# Patient Record
Sex: Male | Born: 1995 | Race: Black or African American | Hispanic: No | Marital: Single | Smoking: Current some day smoker
Health system: Southern US, Community
[De-identification: ages and names within clinical notes are randomized; demographics above are authoritative.]

## PROBLEM LIST (undated history)

## (undated) DIAGNOSIS — B009 Herpesviral infection, unspecified: Secondary | ICD-10-CM

## (undated) DIAGNOSIS — J45909 Unspecified asthma, uncomplicated: Secondary | ICD-10-CM

---

## 2015-11-10 ENCOUNTER — Encounter (HOSPITAL_COMMUNITY): Payer: Self-pay | Admitting: Emergency Medicine

## 2015-11-10 ENCOUNTER — Emergency Department (HOSPITAL_COMMUNITY)

## 2015-11-10 DIAGNOSIS — J4520 Mild intermittent asthma, uncomplicated: Secondary | ICD-10-CM | POA: Diagnosis not present

## 2015-11-10 DIAGNOSIS — F17200 Nicotine dependence, unspecified, uncomplicated: Secondary | ICD-10-CM | POA: Insufficient documentation

## 2015-11-10 DIAGNOSIS — R079 Chest pain, unspecified: Secondary | ICD-10-CM | POA: Diagnosis present

## 2015-11-10 DIAGNOSIS — J069 Acute upper respiratory infection, unspecified: Secondary | ICD-10-CM | POA: Diagnosis not present

## 2015-11-10 NOTE — ED Triage Notes (Signed)
Pt. reports generalized chest pain/tightness with SOB , cough and chest congestion onset this week , denies fever or chills.

## 2015-11-11 ENCOUNTER — Emergency Department (HOSPITAL_COMMUNITY)
Admission: EM | Admit: 2015-11-11 | Discharge: 2015-11-11 | Disposition: A | Discharge status: Home / Self Care | Attending: Emergency Medicine | Admitting: Emergency Medicine

## 2015-11-11 DIAGNOSIS — J4520 Mild intermittent asthma, uncomplicated: Secondary | ICD-10-CM

## 2015-11-11 DIAGNOSIS — J069 Acute upper respiratory infection, unspecified: Secondary | ICD-10-CM

## 2015-11-11 HISTORY — DX: Unspecified asthma, uncomplicated: J45.909

## 2015-11-11 LAB — BASIC METABOLIC PANEL
ANION GAP: 7 (ref 5–15)
BUN: 13 mg/dL (ref 6–20)
CALCIUM: 9.7 mg/dL (ref 8.9–10.3)
CO2: 26 mmol/L (ref 22–32)
Chloride: 103 mmol/L (ref 101–111)
Creatinine, Ser: 1.23 mg/dL (ref 0.61–1.24)
GFR calc Af Amer: >60 mL/min (ref >60)
GLUCOSE: 89 mg/dL (ref 65–99)
Potassium: 3.6 mmol/L (ref 3.5–5.1)
Sodium: 136 mmol/L (ref 135–145)

## 2015-11-11 LAB — CBC
HEMATOCRIT: 45.7 % (ref 39.0–52.0)
HEMOGLOBIN: 16.5 g/dL (ref 13.0–17.0)
MCH: 29.5 pg (ref 26.0–34.0)
MCHC: 36.1 g/dL — AB (ref 30.0–36.0)
MCV: 81.6 fL (ref 78.0–100.0)
Platelets: 209 10*3/uL (ref 150–400)
RBC: 5.6 MIL/uL (ref 4.22–5.81)
RDW: 13.6 % (ref 11.5–15.5)
WBC: 9.9 10*3/uL (ref 4.0–10.5)

## 2015-11-11 LAB — I-STAT TROPONIN, ED: TROPONIN I, POC: 0 ng/mL (ref 0.00–0.08)

## 2015-11-11 MED ORDER — PREDNISONE 20 MG PO TABS: 60.0000 mg | ORAL_TABLET | Freq: Every day | ORAL | 0 refills | Status: DC

## 2015-11-11 MED ORDER — PREDNISONE 20 MG PO TABS
60.0000 mg | ORAL_TABLET | Freq: Once | ORAL | Status: AC
  Administered 2015-11-11: 60 mg via ORAL
  Filled 2015-11-11: qty 3

## 2015-11-11 MED ORDER — BENZONATATE 100 MG PO CAPS: 100.0000 mg | ORAL_CAPSULE | Freq: Three times a day (TID) | ORAL | 0 refills | Status: DC | PRN

## 2015-11-11 MED ORDER — IBUPROFEN 800 MG PO TABS
800.0000 mg | ORAL_TABLET | Freq: Once | ORAL | Status: AC
  Administered 2015-11-11: 800 mg via ORAL
  Filled 2015-11-11: qty 1

## 2015-11-11 MED ORDER — ALBUTEROL SULFATE (2.5 MG/3ML) 0.083% IN NEBU
5.0000 mg | INHALATION_SOLUTION | Freq: Once | RESPIRATORY_TRACT | Status: AC
  Administered 2015-11-11: 5 mg via RESPIRATORY_TRACT

## 2015-11-11 NOTE — ED Provider Notes (Signed)
TIME SEEN: 5:30 AM  CHIEF COMPLAINT: Chest pain, nasal congestion, cough, wheezing, body aches, subjective fever  HPI: Pt is a 20 y.o. male with history of asthma who presents to the emergency department with 2-3 days of congestion, dry cough, wheezing and shortness of breath secondary to his asthma, subjective fever, body aches. Reports he has had some intermittent chest pain with coughing. No vomiting or diarrhea. No sick contacts. No history of PE or DVT.  ROS: See HPI Constitutional:  fever  Eyes: no drainage  ENT: no runny nose   Cardiovascular:   chest pain  Resp:  SOB  GI: no vomiting GU: no dysuria Integumentary: no rash  Allergy: no hives  Musculoskeletal: no leg swelling  Neurological: no slurred speech ROS otherwise negative  PAST MEDICAL HISTORY/PAST SURGICAL HISTORY:  Past Medical History:  Diagnosis Date  . Asthma     MEDICATIONS:  Prior to Admission medications   Not on File    ALLERGIES:  No Known Allergies  SOCIAL HISTORY:  Social History  Substance Use Topics  . Smoking status: Current Every Day Smoker  . Smokeless tobacco: Never Used  . Alcohol use Yes    FAMILY HISTORY: No family history on file.  EXAM: BP 129/79 (BP Location: Right Arm)   Pulse 78   Temp 99.3 F (37.4 C) (Oral)   Resp 26   Ht 5\' 7"  (1.702 m)   Wt 135 lb (61.2 kg)   SpO2 99%   BMI 21.14 kg/m  CONSTITUTIONAL: Alert and oriented and responds appropriately to questions. Well-appearing; well-nourished, Afebrile, nontoxic HEAD: Normocephalic EYES: Conjunctivae clear, PERRL ENT: normal nose; no rhinorrhea; moist mucous membranes NECK: Supple, no meningismus, no LAD  CARD: RRR; S1 and S2 appreciated; no murmurs, no clicks, no rubs, no gallops RESP: Normal chest excursion without splinting or tachypnea; breath sounds clear and equal bilaterally; no wheezes, no rhonchi, no rales, no hypoxia or respiratory distress, speaking full sentences ABD/GI: Normal bowel sounds;  non-distended; soft, non-tender, no rebound, no guarding, no peritoneal signs BACK:  The back appears normal and is non-tender to palpation, there is no CVA tenderness EXT: Normal ROM in all joints; non-tender to palpation; no edema; normal capillary refill; no cyanosis, no calf tenderness or swelling    SKIN: Normal color for age and race; warm; no rash NEURO: Moves all extremities equally, sensation to light touch intact diffusely, cranial nerves II through XII intact PSYCH: The patient's mood and manner are appropriate. Grooming and personal hygiene are appropriate.  MEDICAL DECISION MAKING: Patient here with symptoms that sound like a viral illness. Labs including troponin have been negative. Chest x-ray clear. EKG shows no ischemic abnormality. We'll treat symptomatically with ibuprofen, have him continue his albuterol inhaler at home and discharge with prednisone burst. He is not currently wheezing and no respiratory distress or increased work of breathing. Doubt PE, ACS. I do not feel this time he needs antibiotics. We'll give him outpatient follow-up as needed. Discussed return precautions.   At this time, I do not feel there is any life-threatening condition present. I have reviewed and discussed all results (EKG, imaging, lab, urine as appropriate), exam findings with patient/family. I have reviewed nursing notes and appropriate previous records.  I feel the patient is safe to be discharged home without further emergent workup and can continue workup as an outpatient. Discussed usual and customary return precautions. Patient/family verbalize understanding and are comfortable with this plan.  Outpatient follow-up has been provided. All questions have been  answered.      EKG Interpretation  Date/Time:  Wednesday November 11 2015 04:41:24 EDT Ventricular Rate:  77 PR Interval:    QRS Duration: 87 QT Interval:  338 QTC Calculation: 383 R Axis:   55 Text Interpretation:  Sinus rhythm  Atrial premature complex LVH by voltage No old tracing to compare Confirmed by Haili Donofrio,  DO, Dadrian Ballantine ([id number]) on 11/11/2015 4:54:00 AM         Layla MawKristen N Mana Morison, DO 11/11/15 [id number]

## 2015-11-11 NOTE — Discharge Instructions (Signed)
You may alternate between Tylenol 1000 mg every 6 hours as needed for fever and pain and ibuprofen 800 mg every 8 hours as needed for fever and pain. ° ° °To find a primary care or specialty doctor please call [phone] or [phone] to access "[facility] Find a Doctor Service." ° °You may also go on the [facility] website at [email or web address] ° °There are also multiple [facility] practices throughout the [place] that are frequently accepting new patients. You may find a clinic that is close to your home and contact them. ° °[facility] -  °[address] °[place] [address] °[phone] ° °[facility] in [place] (also locations in [place] and [place]) -  °[address] °[place] [address] [phone] ° °[facility] -  °[address] °[place] [address] [phone] ° ° °

## 2015-11-11 NOTE — ED Notes (Signed)
No answer when called for reassessment  

## 2016-07-21 ENCOUNTER — Encounter (HOSPITAL_COMMUNITY): Payer: Self-pay | Admitting: Emergency Medicine

## 2016-07-21 DIAGNOSIS — R300 Dysuria: Secondary | ICD-10-CM | POA: Insufficient documentation

## 2016-07-21 DIAGNOSIS — J45909 Unspecified asthma, uncomplicated: Secondary | ICD-10-CM | POA: Diagnosis not present

## 2016-07-21 DIAGNOSIS — Z79899 Other long term (current) drug therapy: Secondary | ICD-10-CM | POA: Insufficient documentation

## 2016-07-21 DIAGNOSIS — R319 Hematuria, unspecified: Secondary | ICD-10-CM | POA: Diagnosis not present

## 2016-07-21 DIAGNOSIS — F17200 Nicotine dependence, unspecified, uncomplicated: Secondary | ICD-10-CM | POA: Diagnosis not present

## 2016-07-21 LAB — URINALYSIS, ROUTINE W REFLEX MICROSCOPIC
BACTERIA UA: NONE SEEN
Squamous Epithelial / LPF: NONE SEEN

## 2016-07-21 NOTE — ED Triage Notes (Signed)
Pt to ED with c/o hematuria.  St's blood in urine was dark red.  Pt denies injury or pain

## 2016-07-22 ENCOUNTER — Emergency Department (HOSPITAL_COMMUNITY)
Admission: EM | Admit: 2016-07-22 | Discharge: 2016-07-22 | Disposition: A | Discharge status: Home / Self Care | Attending: Emergency Medicine | Admitting: Emergency Medicine

## 2016-07-22 DIAGNOSIS — R300 Dysuria: Secondary | ICD-10-CM

## 2016-07-22 DIAGNOSIS — R319 Hematuria, unspecified: Secondary | ICD-10-CM

## 2016-07-22 MED ORDER — DOXYCYCLINE HYCLATE 100 MG PO CAPS: 100.0000 mg | ORAL_CAPSULE | Freq: Two times a day (BID) | ORAL | 0 refills | Status: DC

## 2016-07-22 MED ORDER — STERILE WATER FOR INJECTION IJ SOLN
INTRAMUSCULAR | Status: AC
  Administered 2016-07-22: 01:00:00
  Filled 2016-07-22: qty 10

## 2016-07-22 MED ORDER — CEFTRIAXONE SODIUM 250 MG IJ SOLR
250.0000 mg | Freq: Once | INTRAMUSCULAR | Status: AC
  Administered 2016-07-22: 250 mg via INTRAMUSCULAR
  Filled 2016-07-22: qty 250

## 2016-07-22 MED ORDER — AZITHROMYCIN 250 MG PO TABS
1000.0000 mg | ORAL_TABLET | Freq: Once | ORAL | Status: AC
  Administered 2016-07-22: 1000 mg via ORAL
  Filled 2016-07-22: qty 4

## 2016-07-22 NOTE — ED Provider Notes (Signed)
[facility]-EMERGENCY DEPT Provider Note   CSN: [id number] Arrival date & time: 07/21/16  2205     History   Chief Complaint Chief Complaint  Patient presents with  . Hematuria    HPI Walter Stanley is a 21 y.o. male.  Patient presents to the ED with a chief complaint of hematuria.  He states that he has had 2 episodes that started tonight.  Reports hx of gonorrhea.  Denies any new discharge.  Denies any pain in his testicles, low back, or abdomen.  He reports mild stinging sensation when urinating.  Denies any pain in his rectum or pressure in pelvis.  He denies any other medical problems except for asthma.  There are no other modifying factors.   The history is provided by the patient. No language interpreter was used.    Past Medical History:  Diagnosis Date  . Asthma     There are no active problems to display for this patient.   History reviewed. No pertinent surgical history.     Home Medications    Prior to Admission medications   Medication Sig Start Date End Date Taking? Authorizing Provider  benzonatate (TESSALON) 100 MG capsule Take 1 capsule (100 mg total) by mouth 3 (three) times daily as needed for cough. 11/11/15   Kristen N Ward, DO  predniSONE (DELTASONE) 20 MG tablet Take 3 tablets (60 mg total) by mouth daily. 11/11/15   Layla MawKristen N Ward, DO    Family History No family history on file.  Social History Social History  Substance Use Topics  . Smoking status: Current Every Day Smoker  . Smokeless tobacco: Never Used  . Alcohol use Yes     Allergies   Patient has no known allergies.   Review of Systems Review of Systems  All other systems reviewed and are negative.    Physical Exam Updated Vital Signs BP 122/66 (BP Location: Right Arm)   Pulse 61   Temp 99.4 F (37.4 C) (Oral)   Resp 16   Ht 5\' 7"  (1.702 m)   Wt 56.7 kg   SpO2 100%   BMI 19.58 kg/m   Physical Exam  Constitutional: He is oriented to person, place, and time. No  distress.  HENT:  Head: Normocephalic and atraumatic.  Eyes: Conjunctivae and EOM are normal. Pupils are equal, round, and reactive to light.  Neck: No tracheal deviation present.  Cardiovascular: Normal rate.   Pulmonary/Chest: Effort normal. No respiratory distress.  Abdominal: Soft.  No focal abdominal tenderness, no RLQ tenderness or pain at McBurney's point, no RUQ tenderness or Murphy's sign, no left-sided abdominal tenderness, no fluid wave, or signs of peritonitis   Genitourinary:  Genitourinary Comments: No testicular tenderness, no masses, no lesions, no discharge, circumcised  Musculoskeletal: Normal range of motion.  Neurological: He is alert and oriented to person, place, and time.  Skin: Skin is warm and dry. He is not diaphoretic.  Psychiatric: Judgment normal.  Nursing note and vitals reviewed.    ED Treatments / Results  Labs (all labs ordered are listed, but only abnormal results are displayed) Labs Reviewed  URINALYSIS, ROUTINE W REFLEX MICROSCOPIC - Abnormal; Notable for the following:       Result Value   Color, Urine RED (*)    APPearance TURBID (*)    Glucose, UA   (*)    Value: TEST NOT REPORTED DUE TO COLOR INTERFERENCE OF URINE PIGMENT   Hgb urine dipstick   (*)    Value: TEST NOT  REPORTED DUE TO COLOR INTERFERENCE OF URINE PIGMENT   Bilirubin Urine   (*)    Value: TEST NOT REPORTED DUE TO COLOR INTERFERENCE OF URINE PIGMENT   Ketones, ur   (*)    Value: TEST NOT REPORTED DUE TO COLOR INTERFERENCE OF URINE PIGMENT   Protein, ur   (*)    Value: TEST NOT REPORTED DUE TO COLOR INTERFERENCE OF URINE PIGMENT   Nitrite   (*)    Value: TEST NOT REPORTED DUE TO COLOR INTERFERENCE OF URINE PIGMENT   Leukocytes, UA   (*)    Value: TEST NOT REPORTED DUE TO COLOR INTERFERENCE OF URINE PIGMENT   All other components within normal limits  URINE CULTURE    EKG  EKG Interpretation None       Radiology No results found.  Procedures Procedures  (including critical care time)  Medications Ordered in ED Medications  azithromycin (ZITHROMAX) tablet 1,000 mg (not administered)  cefTRIAXone (ROCEPHIN) injection 250 mg (not administered)     Initial Impression / Assessment and Plan / ED Course  I have reviewed the triage vital signs and the nursing notes.  Pertinent labs & imaging results that were available during my care of the patient were reviewed by me and considered in my medical decision making (see chart for details).     Patient with hx of gonorrhea.  Presents with hematuria and dysuria.  Will treat with rocephin, azithro and doxy.  Patient declines G/C swab.  Will send urine for culture.  Recommend follow-up with urology if symptoms do not resolve with tonight's treatment plan.  Final Clinical Impressions(s) / ED Diagnoses   Final diagnoses:  Hematuria, unspecified type  Dysuria    New Prescriptions New Prescriptions   DOXYCYCLINE (VIBRAMYCIN) 100 MG CAPSULE    Take 1 capsule (100 mg total) by mouth 2 (two) times daily.     Roxy Horseman, PA-C 07/22/16 0109    Zadie Rhine, MD 07/22/16 [phone]

## 2016-07-22 NOTE — ED Notes (Signed)
Pt reports that approximately 1 hour ago he started to urinate blood. Pt denies any injury.

## 2016-07-23 LAB — URINE CULTURE

## 2016-12-07 ENCOUNTER — Emergency Department (HOSPITAL_COMMUNITY)
Admission: EM | Admit: 2016-12-07 | Discharge: 2016-12-07 | Discharge status: Against Medical Advice | Attending: Emergency Medicine | Admitting: Emergency Medicine

## 2016-12-07 ENCOUNTER — Encounter (HOSPITAL_COMMUNITY): Payer: Self-pay | Admitting: Emergency Medicine

## 2016-12-07 ENCOUNTER — Emergency Department (HOSPITAL_COMMUNITY)

## 2016-12-07 DIAGNOSIS — Z79899 Other long term (current) drug therapy: Secondary | ICD-10-CM | POA: Diagnosis not present

## 2016-12-07 DIAGNOSIS — J45909 Unspecified asthma, uncomplicated: Secondary | ICD-10-CM | POA: Insufficient documentation

## 2016-12-07 DIAGNOSIS — F17200 Nicotine dependence, unspecified, uncomplicated: Secondary | ICD-10-CM | POA: Diagnosis not present

## 2016-12-07 DIAGNOSIS — Z23 Encounter for immunization: Secondary | ICD-10-CM | POA: Insufficient documentation

## 2016-12-07 DIAGNOSIS — M79675 Pain in left toe(s): Secondary | ICD-10-CM | POA: Insufficient documentation

## 2016-12-07 MED ORDER — TETANUS-DIPHTH-ACELL PERTUSSIS 5-2.5-18.5 LF-MCG/0.5 IM SUSP
0.5000 mL | Freq: Once | INTRAMUSCULAR | Status: AC
  Administered 2016-12-07: 0.5 mL via INTRAMUSCULAR
  Filled 2016-12-07: qty 0.5

## 2016-12-07 MED ORDER — IBUPROFEN 200 MG PO TABS
600.0000 mg | ORAL_TABLET | Freq: Once | ORAL | Status: AC
  Administered 2016-12-07: 600 mg via ORAL
  Filled 2016-12-07: qty 3

## 2016-12-07 NOTE — ED Provider Notes (Signed)
[facility]-EMERGENCY DEPT Provider Note   CSN: [id number] Arrival date & time: 12/07/16  1139     History   Chief Complaint Chief Complaint  Patient presents with  . Foot Injury    HPI Walter Stanley is a 21 y.o. male w PMHx asthma, Presenting to the ED for left toe pain that began 4-5 days ago after stepping on a piece of glass. He localizes pain to the plantar aspect of his toe. He states he stepped on a piece of glass however there is no glass visualized in his toe. He reports cleaning immediately with hydrogen peroxide and applying antibiotic ointment, however the pain on his toe has been getting worse since then. He reports today he woke up with 10/10 pain with ambulation. Denies purulent drainage, fever/chills, decreased range of motion. Denies history of immunocompromise or diabetes. Tetanus is not up-to-date.  The history is provided by the patient.    Past Medical History:  Diagnosis Date  . Asthma     There are no active problems to display for this patient.   History reviewed. No pertinent surgical history.     Home Medications    Prior to Admission medications   Medication Sig Start Date End Date Taking? Authorizing Provider  benzonatate (TESSALON) 100 MG capsule Take 1 capsule (100 mg total) by mouth 3 (three) times daily as needed for cough. 11/11/15   Ward, Layla Maw, DO  doxycycline (VIBRAMYCIN) 100 MG capsule Take 1 capsule (100 mg total) by mouth 2 (two) times daily. 07/22/16   Roxy Horseman, PA-C  predniSONE (DELTASONE) 20 MG tablet Take 3 tablets (60 mg total) by mouth daily. 11/11/15   Ward, Layla Maw, DO    Family History No family history on file.  Social History Social History  Substance Use Topics  . Smoking status: Current Every Day Smoker  . Smokeless tobacco: Never Used  . Alcohol use Yes     Allergies   Patient has no known allergies.   Review of Systems Review of Systems  Constitutional: Negative for chills and fever.    Gastrointestinal: Negative for nausea and vomiting.  Skin: Positive for wound.  Allergic/Immunologic: Negative for immunocompromised state.     Physical Exam Updated Vital Signs BP 102/77 (BP Location: Left Arm)   Pulse 60   Temp 98.9 F (37.2 C) (Oral)   Resp 15   Ht  (1.727 m)   Wt 56 kg (123 lb 9 oz)   SpO2 100%   BMI 18.79 kg/m   Physical Exam  Constitutional: He appears well-developed and well-nourished. No distress.  HENT:  Head: Normocephalic and atraumatic.  Eyes: Conjunctivae are normal.  Cardiovascular: Normal rate and intact distal pulses.   Pulmonary/Chest: Effort normal.  Musculoskeletal:  Left great toe with normal range of motion. 2cm area of edema/mild fluctuance on plantar aspect of great toe, not erythematous and no surrounding erythema. No purulent drainage. No deformities.  Neurological:  Normal sensation  Psychiatric: He has a normal mood and affect. His behavior is normal.  Nursing note and vitals reviewed.    ED Treatments / Results  Labs (all labs ordered are listed, but only abnormal results are displayed) Labs Reviewed - No data to display  EKG  EKG Interpretation None       Radiology Dg Foot Complete Left  Result Date: 12/07/2016 CLINICAL DATA:  Stepped on glass 4 days ago. The persistent pain and swelling. EXAM: LEFT FOOT - COMPLETE 3+ VIEW COMPARISON:  None. FINDINGS: The joint  spaces are normal. No acute bony findings. No obvious radiopaque foreign body. IMPRESSION: No acute bony findings or radiopaque foreign body. Electronically Signed   By: Rudie Meyer M.D.   On: 12/07/2016 13:29    Procedures Procedures (including critical care time)  Medications Ordered in ED Medications  ibuprofen (ADVIL,MOTRIN) tablet 600 mg (600 mg Oral Given 12/07/16 1322)  Tdap (BOOSTRIX) injection 0.5 mL (0.5 mLs Intramuscular Given 12/07/16 1323)     Initial Impression / Assessment and Plan / ED Course  I have reviewed the triage vital  signs and the nursing notes.  Pertinent labs & imaging results that were available during my care of the patient were reviewed by me and considered in my medical decision making (see chart for details).     Patient presenting with left great toe pain after stepping on glass 4-5 days ago. Neurovascularly intact. X-ray negative for retained foreign bodies. Tdap Updated. Planned to ultrasound area to evaluate for abscess, however patient decided to leave AMA because "the room was cold." Discussed risks with patient of leaving before medical treatment. Strict return precautions discussed.   I discussed the nature and purpose, risks and benefits, as well as, the alternatives of treatment. Time was given to allow the opportunity to ask questions and consider their options, and after the discussion, the patient decided to refuse the offerred treatment. The patient was informed that refusal could lead to, but was not limited to, death, permanent disability, or severe pain. If present, I asked the relatives or significant others to dissuade them without success. Prior to refusing, I determined that the patient had the capacity to make their decision and understood the consequences of that decision. After refusal, I made every reasonable opportunity to treat them to the best of my ability.  The patient was notified that they may return to the emergency department at any time for further treatment.     Final Clinical Impressions(s) / ED Diagnoses   Final diagnoses:  Great toe pain, left    New Prescriptions Discharge Medication List as of 12/07/2016  2:04 PM       Russo, [place] N, PA-C 12/07/16 1413    Lorre Nick, MD 12/07/16 1553

## 2016-12-07 NOTE — ED Notes (Signed)
Pt signed AMA due to not wanting to wait. PA has explained risks

## 2016-12-07 NOTE — ED Triage Notes (Signed)
Patient c/o stepping on glass 4-5 days ago. Patient reports that he has gone to work but continues to have pain on left posterior big toe/foot where he stepped on glass.  Patient denies any swellling

## 2017-02-11 ENCOUNTER — Emergency Department (HOSPITAL_COMMUNITY)
Admission: EM | Admit: 2017-02-11 | Discharge: 2017-02-11 | Disposition: A | Discharge status: Home / Self Care | Attending: Emergency Medicine | Admitting: Emergency Medicine

## 2017-02-11 ENCOUNTER — Emergency Department (HOSPITAL_COMMUNITY)

## 2017-02-11 ENCOUNTER — Encounter (HOSPITAL_COMMUNITY): Payer: Self-pay

## 2017-02-11 DIAGNOSIS — R05 Cough: Secondary | ICD-10-CM | POA: Diagnosis present

## 2017-02-11 DIAGNOSIS — F17200 Nicotine dependence, unspecified, uncomplicated: Secondary | ICD-10-CM | POA: Diagnosis not present

## 2017-02-11 DIAGNOSIS — Z79899 Other long term (current) drug therapy: Secondary | ICD-10-CM | POA: Insufficient documentation

## 2017-02-11 DIAGNOSIS — B349 Viral infection, unspecified: Secondary | ICD-10-CM | POA: Diagnosis not present

## 2017-02-11 NOTE — ED Triage Notes (Signed)
Patient complains of several days of cold symptoms with congestion and headache, states that he has been working in the cold and thinks related, alert and oriented, NAD

## 2017-02-11 NOTE — ED Provider Notes (Signed)
[facility]ENC[facility] Provider Note   CSN: [id number] Arrival date & time: 02/11/17  1342     History   Chief Complaint Chief Complaint  Patient presents with  . asthma/congestion    HPI Jonita AlbeeMalcom Demby is a 21 y.o. male.  HPI 21 year old male with a history of asthma presenting with 3-4 days of congestion, cough, mild headache, sore throat and chest tightness.  He states he is tried his albuterol inhaler with no relief.  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  He states that he has had "fluid in his lungs" before and he wants to make sure it is not that although he states it feels different than that.  Denies any fevers chills denies nausea, vomiting, diarrhea or abdominal pain.  He states he becomes slightly more short of breath when he walks but otherwise no alleviating or exacerbating factors.  Has not tried anything for his symptoms.  Past Medical History:  Diagnosis Date  . Asthma     There are no active problems to display for this patient.   History reviewed. No pertinent surgical history.     Home Medications    Prior to Admission medications   Medication Sig Start Date End Date Taking? Authorizing Provider  benzonatate (TESSALON) 100 MG capsule Take 1 capsule (100 mg total) by mouth 3 (three) times daily as needed for cough. 11/11/15   Ward, Layla MawKristen N, DO  doxycycline (VIBRAMYCIN) 100 MG capsule Take 1 capsule (100 mg total) by mouth 2 (two) times daily. 07/22/16   Roxy HorsemanBrowning, Robert, PA-C  predniSONE (DELTASONE) 20 MG tablet Take 3 tablets (60 mg total) by mouth daily. 11/11/15   Ward, Layla MawKristen N, DO    Family History No family history on file.  Social History Social History   Tobacco Use  . Smoking status: Current Every Day Smoker  . Smokeless tobacco: Never Used  Substance Use Topics  . Alcohol use: Yes  . Drug use: Yes    Types: Marijuana     Allergies   Patient has no known allergies.   Review of  Systems Review of Systems  Constitutional: Negative for chills and fever.  HENT: Positive for congestion and sore throat. Negative for ear pain.   Eyes: Negative for pain and visual disturbance.  Respiratory: Positive for cough and shortness of breath.   Cardiovascular: Negative for chest pain and palpitations.  Gastrointestinal: Negative for abdominal pain and vomiting.  Genitourinary: Negative for dysuria and hematuria.  Musculoskeletal: Negative for arthralgias and back pain.  Skin: Negative for color change and rash.  Neurological: Negative for seizures and syncope.  All other systems reviewed and are negative.    Physical Exam Updated Vital Signs BP 126/81 (BP Location: Right Arm)   Pulse 83   Temp 98.2 F (36.8 C) (Oral)   Resp 16   SpO2 100%   Physical Exam  Constitutional: He appears well-developed and well-nourished.  HENT:  Head: Normocephalic and atraumatic.  Mouth/Throat: Oropharynx is clear and moist.  Eyes: Conjunctivae are normal.  Neck: Neck supple.  Cardiovascular: Normal rate and regular rhythm.  No murmur heard. Pulmonary/Chest: Effort normal and breath sounds normal. No respiratory distress. He has no decreased breath sounds. He has no wheezes. He has no rhonchi. He has no rales.  Abdominal: Soft. There is no tenderness.  Musculoskeletal: He exhibits no edema.  Neurological: He is alert.  Skin: Skin is warm and dry. Capillary refill takes less than 2  seconds.  Psychiatric: He has a normal mood and affect.  Nursing note and vitals reviewed.    ED Treatments / Results  Labs (all labs ordered are listed, but only abnormal results are displayed) Labs Reviewed - No data to display  EKG  EKG Interpretation  Date/Time:  Saturday February 11 2017 13:59:47 EST Ventricular Rate:  82 PR Interval:    QRS Duration: 82 QT Interval:  329 QTC Calculation: 385 R Axis:   66 Text Interpretation:  Sinus rhythm Biatrial enlargement RSR' in V1 or V2, right VCD  or RVH Probable left ventricular hypertrophy ST elev, probable normal early repol pattern similar to previous  Confirmed by Crista CurbLiu, Dana [id number]) on 02/11/2017 2:04:56 PM       Radiology Dg Chest 2 View  Result Date: 02/11/2017 CLINICAL DATA:  21 year old male with 4 days of cold like symptoms. EXAM: CHEST  2 VIEW COMPARISON:  11/10/2015 FINDINGS: The heart size and mediastinal contours are within normal limits. Both lungs are clear. The visualized skeletal structures are unremarkable. IMPRESSION: No active cardiopulmonary disease. Electronically Signed   By: Sande BrothersSerena  Chacko M.D.   On: 02/11/2017 16:41    Procedures Procedures (including critical care time)  Medications Ordered in ED Medications - No data to display   Initial Impression / Assessment and Plan / ED Course  I have reviewed the triage vital signs and the nursing notes.  Pertinent labs & imaging results that were available during my care of the patient were reviewed by me and considered in my medical decision making (see chart for details).     21yoM with history of asthma presenting with 3-4 days of worsening cough, congestion, chest tightness, mild headache and sore throat.  Exam is as above and grossly unremarkable.  No wheezing on exam.  Chest x-ray unremarkable with no signs of pneumonia or pneumothorax.  Likely viral syndrome.  EKG is normal sinus rhythm with no ischemic changes. Doubt ACS or PE due to lack of risk factors and age.  Symptomatic treatment discussed.  Patient amenable to plan.  We will follow-up PCP.  Final Clinical Impressions(s) / ED Diagnoses   Final diagnoses:  Viral syndrome    ED Discharge Orders    None       Jalisia Puchalski Italyhad, MD 02/11/17 1704    Blane OharaZavitz, Joshua, MD 02/11/17 2326

## 2018-04-25 ENCOUNTER — Encounter (HOSPITAL_COMMUNITY): Payer: Self-pay | Admitting: Emergency Medicine

## 2018-04-25 ENCOUNTER — Other Ambulatory Visit: Payer: Self-pay

## 2018-04-25 ENCOUNTER — Emergency Department (HOSPITAL_COMMUNITY)
Admission: EM | Admit: 2018-04-25 | Discharge: 2018-04-25 | Disposition: A | Discharge status: Home / Self Care | Attending: Emergency Medicine | Admitting: Emergency Medicine

## 2018-04-25 ENCOUNTER — Emergency Department (HOSPITAL_COMMUNITY)

## 2018-04-25 DIAGNOSIS — Z79899 Other long term (current) drug therapy: Secondary | ICD-10-CM | POA: Diagnosis not present

## 2018-04-25 DIAGNOSIS — J45901 Unspecified asthma with (acute) exacerbation: Secondary | ICD-10-CM

## 2018-04-25 DIAGNOSIS — J069 Acute upper respiratory infection, unspecified: Secondary | ICD-10-CM | POA: Diagnosis not present

## 2018-04-25 DIAGNOSIS — B9789 Other viral agents as the cause of diseases classified elsewhere: Secondary | ICD-10-CM

## 2018-04-25 DIAGNOSIS — F17210 Nicotine dependence, cigarettes, uncomplicated: Secondary | ICD-10-CM | POA: Diagnosis not present

## 2018-04-25 DIAGNOSIS — R05 Cough: Secondary | ICD-10-CM | POA: Diagnosis present

## 2018-04-25 MED ORDER — PREDNISONE 20 MG PO TABS
50.0000 mg | ORAL_TABLET | Freq: Once | ORAL | Status: AC
  Administered 2018-04-25: 50 mg via ORAL
  Filled 2018-04-25: qty 3

## 2018-04-25 MED ORDER — IPRATROPIUM-ALBUTEROL 0.5-2.5 (3) MG/3ML IN SOLN
3.0000 mL | Freq: Once | RESPIRATORY_TRACT | Status: AC
  Administered 2018-04-25: 3 mL via RESPIRATORY_TRACT
  Filled 2018-04-25: qty 3

## 2018-04-25 MED ORDER — PREDNISONE 50 MG PO TABS: 50.0000 mg | ORAL_TABLET | Freq: Every day | ORAL | 0 refills | Status: AC

## 2018-04-25 NOTE — ED Notes (Signed)
Patient verbalizes understanding of discharge instructions. Opportunity for questioning and answers were provided. Armband removed by staff, pt discharged from ED. Pt ambulatory to lobby.  

## 2018-04-25 NOTE — ED Provider Notes (Signed)
[facility] Provider Note   CSN: [id number] Arrival date & time: 04/25/18  1853     History   Chief Complaint No chief complaint on file.   HPI Walter Stanley is a 23 y.o. male.  The history is provided by the patient and medical records. No language interpreter was used.  Cough  Cough characteristics:  Productive Sputum characteristics:  Green Severity:  Moderate Onset quality:  Gradual Duration:  1 week Timing:  Intermittent Progression:  Waxing and waning Chronicity:  Recurrent Smoker: yes   Relieved by:  Nothing Worsened by:  Nothing Ineffective treatments:  None tried Associated symptoms: chills, rhinorrhea, shortness of breath, sinus congestion and wheezing   Associated symptoms: no chest pain, no diaphoresis, no fever, no headaches, no rash and no weight loss     Past Medical History:  Diagnosis Date  . Asthma     There are no active problems to display for this patient.   No past surgical history on file.      Home Medications    Prior to Admission medications   Medication Sig Start Date End Date Taking? Authorizing Provider  benzonatate (TESSALON) 100 MG capsule Take 1 capsule (100 mg total) by mouth 3 (three) times daily as needed for cough. 11/11/15   Ward, Layla MawKristen N, DO  doxycycline (VIBRAMYCIN) 100 MG capsule Take 1 capsule (100 mg total) by mouth 2 (two) times daily. 07/22/16   Roxy HorsemanBrowning, Robert, PA-C  predniSONE (DELTASONE) 20 MG tablet Take 3 tablets (60 mg total) by mouth daily. 11/11/15   Ward, Layla MawKristen N, DO    Family History No family history on file.  Social History Social History   Tobacco Use  . Smoking status: Current Every Day Smoker  . Smokeless tobacco: Never Used  Substance Use Topics  . Alcohol use: Yes  . Drug use: Yes    Types: Marijuana     Allergies   Patient has no known allergies.   Review of Systems Review of Systems  Constitutional: Positive for chills. Negative for  diaphoresis, fatigue, fever and weight loss.  HENT: Positive for congestion and rhinorrhea.   Eyes: Negative for visual disturbance.  Respiratory: Positive for cough, chest tightness, shortness of breath and wheezing. Negative for choking and stridor.   Cardiovascular: Negative for chest pain.  Gastrointestinal: Negative for abdominal pain, constipation, diarrhea and nausea.  Genitourinary: Negative for flank pain and frequency.  Musculoskeletal: Negative for back pain, neck pain and neck stiffness.  Skin: Negative for rash and wound.  Neurological: Negative for weakness, light-headedness and headaches.  Psychiatric/Behavioral: Negative for agitation.  All other systems reviewed and are negative.    Physical Exam Updated Vital Signs BP 109/83   Pulse 76   Temp 98.7 F (37.1 C) (Oral)   Resp 16   Ht 5\' 8"  (1.727 m)   Wt 59.9 kg   SpO2 100%   BMI 20.07 kg/m   Physical Exam Vitals signs and nursing note reviewed.  Constitutional:      General: He is not in acute distress.    Appearance: He is well-developed. He is not ill-appearing, toxic-appearing or diaphoretic.  HENT:     Head: Normocephalic and atraumatic.     Mouth/Throat:     Pharynx: No pharyngeal swelling.  Eyes:     Conjunctiva/sclera: Conjunctivae normal.     Pupils: Pupils are equal, round, and reactive to light.  Neck:     Musculoskeletal: Neck supple.  Cardiovascular:  Rate and Rhythm: Normal rate and regular rhythm.     Heart sounds: No murmur.  Pulmonary:     Effort: Pulmonary effort is normal. No tachypnea or respiratory distress.     Breath sounds: No stridor. Wheezing present. No decreased breath sounds, rhonchi or rales.  Chest:     Chest wall: No tenderness.  Abdominal:     Palpations: Abdomen is soft.     Tenderness: There is no abdominal tenderness.  Skin:    General: Skin is warm and dry.     Capillary Refill: Capillary refill takes less than 2 seconds.  Neurological:     General: No  focal deficit present.     Mental Status: He is alert.  Psychiatric:        Mood and Affect: Mood normal.      ED Treatments / Results  Labs (all labs ordered are listed, but only abnormal results are displayed) Labs Reviewed - No data to display  EKG None  Radiology Dg Chest 2 View  Result Date: 04/25/2018 CLINICAL DATA:  Productive cough for 1.5 weeks.  Chills. EXAM: CHEST - 2 VIEW COMPARISON:  02/11/2017 FINDINGS: Mild hyperinflation. Normal heart size and pulmonary vascularity. No focal airspace disease or consolidation in the lungs. No blunting of costophrenic angles. No pneumothorax. Mediastinal contours appear intact. IMPRESSION: No active cardiopulmonary disease. Electronically Signed   By: Burman Nieves M.D.   On: 04/25/2018 19:47    Procedures Procedures (including critical care time)  Medications Ordered in ED Medications  ipratropium-albuterol (DUONEB) 0.5-2.5 (3) MG/3ML nebulizer solution 3 mL (3 mLs Nebulization Given 04/25/18 1918)  predniSONE (DELTASONE) tablet 50 mg (50 mg Oral Given by Other 04/25/18 2047)     Initial Impression / Assessment and Plan / ED Course  I have reviewed the triage vital signs and the nursing notes.  Pertinent labs & imaging results that were available during my care of the patient were reviewed by me and considered in my medical decision making (see chart for details).     Walter Stanley is a 23 y.o. male with a past medical history is no her asthma and pneumonia several months ago who presents with rhinorrhea, congestion, cough, chills, and shortness of breath.  Patient reports that his symptoms feel similar when he had pneumonia several months ago.  He reports that he was given antibiotics and was able to go home and did very well.  He says that over the last week he has had the similar symptoms and wants to rule out pneumonia.  He reports that he is having minimal shortness of breath and his wheezing has been slightly worsened  than baseline.  He denies chest pain or chest pressure.  He denies any nausea, vomiting, urinary symptoms or GI symptoms.  Denies recent trauma.  He reports that he is coughing up a green phlegm-like sputum.  He denies any recent medication changes and rarely has used an inhaler at home.  On exam, patient had some wheezing but no rhonchi.  No crackles.  Chest was nontender.  Abdomen was nontender.  Patient had normal gait.  Vital signs reassuring on arrival and patient is afebrile.  Clinical aspect he has a viral URI exacerbating his asthma however given his history of pneumonia and similar symptoms, will get x-ray to rule out pneumonia.  He will be given a breathing treatment.  If pneumonia is ruled out, anticipate patient being treated as an outpatient for asthma exacerbation in the setting of URI with steroids  and albuterol.  If pneumonia is discovered, will ambulate to see if he requires admission or further lab testing.  X-ray shows no pneumonia.  Patient is feeling much better after breathing treatment.  Patient was reassessed and had no wheezing.  Patient given a dose of prednisone and will given prescription for burst of steroids.  He will use his inhaler at home.  Suspect viral URI causing his symptoms.  Patient understood plan of care and return precautions.  Patient discharged in good condition with resolved shortness of breath and no PNA.      Final Clinical Impressions(s) / ED Diagnoses   Final diagnoses:  Viral URI with cough  Exacerbation of asthma, unspecified asthma severity, unspecified whether persistent    ED Discharge Orders    None     \ Clinical Impression: 1. Viral URI with cough   2. Exacerbation of asthma, unspecified asthma severity, unspecified whether persistent     Disposition: Discharge  Condition: Good  I have discussed the results, Dx and Tx plan with the pt(& family if present). He/she/they expressed understanding and agree(s) with the plan.  Discharge instructions discussed at great length. Strict return precautions discussed and pt &/or family have verbalized understanding of the instructions. No further questions at time of discharge.    Discharge Medication List as of 04/25/2018  8:42 PM    START taking these medications   Details  !! predniSONE (DELTASONE) 50 MG tablet Take 1 tablet (50 mg total) by mouth daily for 4 days., Starting Thu 04/26/2018, Until Mon 04/30/2018, Print     !! - Potential duplicate medications found. Please discuss with provider.      Follow Up: [facility] [address] [place] [id number] [phone] Schedule an appointment as soon as possible for a visit    [facility] [address] [id number] [place] [phone]       Tegeler, Canary Brimhristopher J, MD 04/26/18 [phone]

## 2018-04-25 NOTE — Discharge Instructions (Signed)
Your imaging today did not show pneumonia.  We suspect you have asthma exacerbation from likely viral upper respiratory infection.  Please take the steroid burst for the next 4 days starting tomorrow.  You received today's dose already.  Please use your inhaler at home.  Please rest.  If any symptoms change or worsen, is return to nearest emergency department.  Please wash your hands to prevent spread of viral infections.

## 2018-04-25 NOTE — ED Triage Notes (Signed)
Pt arrives to ED from home with complaints of cough of green phelm, SOB, chills and asthma flair ups.Pt was recently diagnosed with pneumonia in December.

## 2018-08-02 ENCOUNTER — Other Ambulatory Visit: Payer: Self-pay

## 2018-08-02 ENCOUNTER — Ambulatory Visit (HOSPITAL_COMMUNITY)
Admission: EM | Admit: 2018-08-02 | Discharge: 2018-08-02 | Disposition: A | Discharge status: Home / Self Care | Attending: Internal Medicine | Admitting: Internal Medicine

## 2018-08-02 ENCOUNTER — Encounter (HOSPITAL_COMMUNITY): Payer: Self-pay | Admitting: Emergency Medicine

## 2018-08-02 DIAGNOSIS — Z202 Contact with and (suspected) exposure to infections with a predominantly sexual mode of transmission: Secondary | ICD-10-CM | POA: Insufficient documentation

## 2018-08-02 DIAGNOSIS — R300 Dysuria: Secondary | ICD-10-CM

## 2018-08-02 DIAGNOSIS — L739 Follicular disorder, unspecified: Secondary | ICD-10-CM

## 2018-08-02 NOTE — ED Triage Notes (Signed)
Patient reports being tested one month ago in [place], [place].  Patient had a "bump" on shaft of penis at that time and was diagnosed with trich/gonorrhea.  Patient received an injection and took po antibiotics.  Patient reports urination and ejaculation just doesn't feel right, "uncomfortable feeling".

## 2018-08-02 NOTE — ED Provider Notes (Signed)
[facility]    CSN: [id number] Arrival date & time: 08/02/18  1229     History   Chief Complaint Chief Complaint  Patient presents with  . SEXUALLY TRANSMITTED DISEASE    HPI Walter Stanley is a 23 y.o. male with a history of asthma, recently treated for gonorrhea about a month ago in [facility]   Patient comes to urgent care today with complaints of some painless lesions at the base of the penis.  Lesions are noted a few days ago and has been persistent.  Lesions are not painful.  Patient has not tried any over-the-counter medications.  He also complains of some discomfort with urination and ejaculation.  He denies any urethral discharge.  Patient has been sexually active after his treatment for GC/trichomonas.  Sexual encounter was unprotected and sexual partner is not a regular sexual partner.  This was apparently is casual sexual encounter  Past Medical History:  Diagnosis Date  . Asthma     There are no active problems to display for this patient.   History reviewed. No pertinent surgical history.     Home Medications    Prior to Admission medications   Not on File    Family History History reviewed. No pertinent family history.  Social History Social History   Tobacco Use  . Smoking status: Current Every Day Smoker  . Smokeless tobacco: Never Used  Substance Use Topics  . Alcohol use: Yes  . Drug use: Yes    Types: Marijuana     Allergies   Patient has no known allergies.   Review of Systems Review of Systems  Constitutional: Negative.   Respiratory: Negative.   Cardiovascular: Negative.   Gastrointestinal: Negative for abdominal distention, abdominal pain, diarrhea and nausea.  Genitourinary: Positive for dysuria and genital sores. Negative for discharge, frequency, penile pain, penile swelling, scrotal swelling, testicular pain and urgency.  Musculoskeletal: Negative.   Allergic/Immunologic: Negative.       Physical Exam Triage Vital Signs ED Triage Vitals  Enc Vitals Group     BP 08/02/18 1325 99/72     Pulse Rate 08/02/18 1325 61     Resp 08/02/18 1325 16     Temp 08/02/18 1325 98.1 F (36.7 C)     Temp Source 08/02/18 1325 Oral     SpO2 08/02/18 1325 98 %     Weight --      Height --      Head Circumference --      Peak Flow --      Pain Score 08/02/18 1324 0     Pain Loc --      Pain Edu? --      Excl. in GC? --    No data found.  Updated Vital Signs BP 99/72 (BP Location: Left Arm)   Pulse 61   Temp 98.1 F (36.7 C) (Oral)   Resp 16   SpO2 98%   Visual Acuity Right Eye Distance:   Left Eye Distance:   Bilateral Distance:    Right Eye Near:   Left Eye Near:    Bilateral Near:     Physical Exam Cardiovascular:     Rate and Rhythm: Normal rate and regular rhythm.     Pulses: Normal pulses.     Heart sounds: Normal heart sounds.  Pulmonary:     Effort: Pulmonary effort is normal.  Abdominal:     General: Bowel sounds are normal. There is no distension.  Palpations: Abdomen is soft.     Tenderness: There is no abdominal tenderness.  Genitourinary:    Penis: Normal.      Scrotum/Testes: Normal.     Comments: Multiple pustular lesions at the base of the penis.  No surrounding erythema.  Not tender to palpation. Skin:    General: Skin is warm.     Capillary Refill: Capillary refill takes less than 2 seconds.     Coloration: Skin is not jaundiced.     Findings: No bruising or lesion.  Neurological:     General: No focal deficit present.      UC Treatments / Results  Labs (all labs ordered are listed, but only abnormal results are displayed) Labs Reviewed  RPR  HIV ANTIBODY (ROUTINE TESTING W REFLEX)  URINE CYTOLOGY ANCILLARY ONLY    EKG None  Radiology No results found.  Procedures Procedures (including critical care time)  Medications Ordered in UC Medications - No data to display  Initial Impression / Assessment and Plan / UC Course   I have reviewed the triage vital signs and the nursing notes.  Pertinent labs & imaging results that were available during my care of the patient were reviewed by me and considered in my medical decision making (see chart for details).     1.  Folliculitis: Personal hygiene Pustular lesions are mild and I suspect that they will resolve spontaneously Patient is advised to return to urgent care if lesions get bigger or if there is surrounding erythema. Patient verbalized understanding  2.  Possible STD exposure: Urine for GC/chlamydia/trichomonas Safe sex education given Final Clinical Impressions(s) / UC Diagnoses   Final diagnoses:  Exposure to sexually transmitted disease (STD)   Discharge Instructions   None    ED Prescriptions    None     Controlled Substance Prescriptions [place] Controlled Substance Registry consulted? No   Merrilee JanskyLamptey, Latosha Gaylord O, MD 08/03/18 1300

## 2018-08-03 LAB — URINE CYTOLOGY ANCILLARY ONLY
Chlamydia: NEGATIVE
Neisseria Gonorrhea: NEGATIVE

## 2018-08-03 LAB — RPR: RPR Ser Ql: NONREACTIVE

## 2018-08-03 LAB — HIV ANTIBODY (ROUTINE TESTING W REFLEX): HIV Screen 4th Generation wRfx: NONREACTIVE

## 2018-08-06 ENCOUNTER — Telehealth (HOSPITAL_COMMUNITY): Payer: Self-pay | Admitting: Emergency Medicine

## 2018-08-06 MED ORDER — DOXYCYCLINE HYCLATE 100 MG PO CAPS: 100.0000 mg | ORAL_CAPSULE | Freq: Two times a day (BID) | ORAL | 0 refills | Status: AC

## 2018-08-06 NOTE — Telephone Encounter (Signed)
Patient called asking about test results, results were negative, pt c/o still having tingling at the tip of his penis with pain when he ejaculates. Dr. Milus Glazier reviewed chart, told to send doxy to pat preferred pharmacy and if pt still has symptoms after medicine to return to be reseen. Pt agreeable to plan.

## 2018-11-04 ENCOUNTER — Other Ambulatory Visit: Payer: Self-pay

## 2018-11-04 ENCOUNTER — Emergency Department (HOSPITAL_COMMUNITY)
Admission: EM | Admit: 2018-11-04 | Discharge: 2018-11-04 | Disposition: A | Discharge status: Home / Self Care | Payer: Self-pay | Attending: Emergency Medicine | Admitting: Emergency Medicine

## 2018-11-04 ENCOUNTER — Encounter (HOSPITAL_COMMUNITY): Payer: Self-pay

## 2018-11-04 DIAGNOSIS — N4889 Other specified disorders of penis: Secondary | ICD-10-CM | POA: Insufficient documentation

## 2018-11-04 DIAGNOSIS — J45909 Unspecified asthma, uncomplicated: Secondary | ICD-10-CM | POA: Insufficient documentation

## 2018-11-04 DIAGNOSIS — F17200 Nicotine dependence, unspecified, uncomplicated: Secondary | ICD-10-CM | POA: Insufficient documentation

## 2018-11-04 DIAGNOSIS — Z8619 Personal history of other infectious and parasitic diseases: Secondary | ICD-10-CM

## 2018-11-04 HISTORY — DX: Herpesviral infection, unspecified: B00.9

## 2018-11-04 MED ORDER — VALACYCLOVIR HCL 1 G PO TABS: 1000.0000 mg | ORAL_TABLET | Freq: Every day | ORAL | 0 refills | Status: AC

## 2018-11-04 NOTE — Discharge Instructions (Addendum)
Take valtrex as prescribed. Take the entire course.  If you are continuing to have recurrent episodes, follow up with the health department for further management. Return to the ER with any new, worsening, or concerning symptoms.

## 2018-11-04 NOTE — ED Provider Notes (Signed)
[facility]EMERG[facility]PT Provider Note   CSN: [id number] Arrival date & time: 11/04/18  1615     History   Chief Complaint Chief Complaint  Patient presents with  . Groin Pain    HPI Walter Stanley is a 23 y.o. male presenting for evaluation of penile irritation.  Patient states for the past 2 days he has been having irritation at the base of his penis, which is atypical precursor for before he has a herpes outbreak.  Patient states he has had 3 herpes outbreaks in about 6 months.  He is sexually active with one male partner who is symptom-free, however has had herpes in the past.  She currently does not have any vesicles or lesions.  He denies any penile pain, testicular pain or swelling, or penile discharge.  He denies urinary symptoms.  He has no other medical problems, takes no medications daily.     HPI  Past Medical History:  Diagnosis Date  . Asthma   . Herpes     There are no active problems to display for this patient.   History reviewed. No pertinent surgical history.      Home Medications    Prior to Admission medications   Medication Sig Start Date End Date Taking? Authorizing Provider  valACYclovir (VALTREX) 1000 MG tablet Take 1 tablet (1,000 mg total) by mouth daily for 5 days. 11/04/18 11/09/18  Asaad Gulley, PA-C    Family History History reviewed. No pertinent family history.  Social History Social History   Tobacco Use  . Smoking status: Current Every Day Smoker  . Smokeless tobacco: Never Used  Substance Use Topics  . Alcohol use: Yes  . Drug use: Yes    Types: Marijuana     Allergies   Patient has no known allergies.   Review of Systems Review of Systems  Constitutional: Negative for fever.  Gastrointestinal: Negative for abdominal pain, nausea and vomiting.  Genitourinary: Negative for decreased urine volume, difficulty urinating, discharge, dysuria, frequency, genital sores, hematuria, penile  pain, penile swelling, scrotal swelling, testicular pain and urgency.       Penile irritation     Physical Exam Updated Vital Signs BP 112/68   Pulse 80   Temp 98.6 F (37 C) (Oral)   Resp 14   Ht 5\' 8"  (1.727 m)   Wt 59.9 kg   SpO2 97%   BMI 20.07 kg/m   Physical Exam Vitals signs and nursing note reviewed. Exam conducted with a chaperone present.  Constitutional:      General: He is not in acute distress.    Appearance: He is well-developed.     Comments: Sitting comfortably in the chair no acute distress  HENT:     Head: Normocephalic and atraumatic.  Eyes:     Conjunctiva/sclera: Conjunctivae normal.     Pupils: Pupils are equal, round, and reactive to light.  Neck:     Musculoskeletal: Normal range of motion and neck supple.  Cardiovascular:     Rate and Rhythm: Normal rate and regular rhythm.     Pulses: Normal pulses.  Pulmonary:     Effort: Pulmonary effort is normal. No respiratory distress.     Breath sounds: Normal breath sounds. No wheezing.  Abdominal:     General: There is no distension.     Palpations: Abdomen is soft. There is no mass.     Tenderness: There is no abdominal tenderness. There is no guarding or rebound.  Genitourinary:  Penis: Normal and circumcised.      Scrotum/Testes: Normal.     Epididymis:     Right: Normal.     Left: Normal.     Comments: No lesions noted.  No inguinal lymphadenopathy.  No tenderness palpation of testicles.  No penile discharge. Musculoskeletal: Normal range of motion.  Skin:    General: Skin is warm and dry.  Neurological:     Mental Status: He is alert and oriented to person, place, and time.      ED Treatments / Results  Labs (all labs ordered are listed, but only abnormal results are displayed) Labs Reviewed - No data to display  EKG None  Radiology No results found.  Procedures Procedures (including critical care time)  Medications Ordered in ED Medications - No data to display    Initial Impression / Assessment and Plan / ED Course  I have reviewed the triage vital signs and the nursing notes.  Pertinent labs & imaging results that were available during my care of the patient were reviewed by me and considered in my medical decision making (see chart for details).        Patient presenting for evaluation of penile discomfort which feels similar to his previous of herpes outbreaks.  Physical exam reassuring, no lesions noted at this time.  No penile discharge or other signs consistent with STDs.  Per chart review, patient was checked for STDs a month ago, was negative at that time.  Continues patient states he feels consistent with prior herpes outbreaks, will treat with Valtrex.  Encourage follow-up with health department for discussion of possible need for prophylactic antivirals.  At this time, patient appears safe for discharge.  Return precautions given.  Patient states he understands and agrees to plan.   Final Clinical Impressions(s) / ED Diagnoses   Final diagnoses:  Penile irritation  History of herpes genitalis    ED Discharge Orders         Ordered    valACYclovir (VALTREX) 1000 MG tablet  Daily     11/04/18 1727           Franchot Heidelberg, PA-C 11/04/18 1915    Maudie Flakes, MD 11/07/18 [phone]

## 2018-11-04 NOTE — ED Triage Notes (Signed)
States has herpes simplex two and has tingling for two days and wants medicine to prevent breakout.

## 2019-01-14 ENCOUNTER — Other Ambulatory Visit: Payer: Self-pay

## 2019-01-14 ENCOUNTER — Ambulatory Visit (HOSPITAL_COMMUNITY)
Admission: EM | Admit: 2019-01-14 | Discharge: 2019-01-14 | Disposition: A | Discharge status: Home / Self Care | Payer: Self-pay | Attending: Family Medicine | Admitting: Family Medicine

## 2019-01-14 ENCOUNTER — Encounter (HOSPITAL_COMMUNITY): Payer: Self-pay

## 2019-01-14 ENCOUNTER — Ambulatory Visit (INDEPENDENT_AMBULATORY_CARE_PROVIDER_SITE_OTHER): Payer: Self-pay

## 2019-01-14 DIAGNOSIS — J189 Pneumonia, unspecified organism: Secondary | ICD-10-CM

## 2019-01-14 MED ORDER — AMOXICILLIN-POT CLAVULANATE 875-125 MG PO TABS: 1.0000 | ORAL_TABLET | Freq: Two times a day (BID) | ORAL | 0 refills | Status: AC

## 2019-01-14 MED ORDER — AZITHROMYCIN 250 MG PO TABS: ORAL_TABLET | ORAL | 0 refills | Status: AC

## 2019-01-14 NOTE — ED Triage Notes (Signed)
Patient presents to Urgent Care with complaints of continued shortness of breath and chills since about a week ago. Patient reports he went to [facility] rex and they ruled out covid and pneumonia, gave him steroids and an inhaler, which he has been taking w/ no relief.

## 2019-01-14 NOTE — Discharge Instructions (Addendum)
Your xray does have an area which is concerning for pneumonia, therefore I have sent antibiotics for you to complete to treat this. Complete prednisone as previously prescribed.  Push fluids to ensure adequate hydration and keep secretions thin.  Tylenol and/or ibuprofen as needed for pain or fevers.   Use of inhaler as needed for wheezing or shortness of breath.   If symptoms worsen or do not improve in the next week to return to be seen or to follow up with your PCP.  Continue to decrease to quit smoking.

## 2019-01-14 NOTE — ED Provider Notes (Signed)
[facility]    CSN: [id number] Arrival date & time: 01/14/19  1353      History   Chief Complaint Chief Complaint  Patient presents with  . Chills  . Shortness of Breath  . Cough    HPI Walter Stanley is a 23 y.o. male.   Walter BorgMalcolm Arya presents with complaints of shortness of breath , cough, chills. Fatigue. Headache and head pressure. Chest is tight and painful with cough. Vomited last night, he feels like it was related to feeling hot and then cold. Started around 10/19. Went to the doctor on 10/21 and was negative for covid and had a negative cxr. Cough is less productive than before. Last night chills worse. Congestion. Cough is tight. Throat feels dry. No ear pain. No other nausea or diarrhea. No known ill contacts. 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 History of pneumonia in the past, states this originally felt similar. Smokes approximately 3-4 cigarettes a day. Inhaler briefly helps. No help with prednisone which was prescribed on 10/21. No tylenol or ibuprofen. Mucinex, has helped some. History  Of asthma.     ROS per HPI, negative if not otherwise mentioned.      Past Medical History:  Diagnosis Date  . Asthma   . Herpes     There are no active problems to display for this patient.   History reviewed. No pertinent surgical history.     Home Medications    Prior to Admission medications   Medication Sig Start Date End Date Taking? Authorizing Provider  albuterol (VENTOLIN HFA) 108 (90 Base) MCG/ACT inhaler Inhale 2 puffs into the lungs every 4 (four) hours as needed. 01/09/19  Yes [provider]  predniSONE (DELTASONE) 20 MG tablet  01/09/19  Yes [provider]  amoxicillin-clavulanate (AUGMENTIN) 875-125 MG tablet Take 1 tablet by mouth every 12 (twelve) hours for 10 days. 01/14/19 01/24/19  Georgetta HaberBurky, Aryia Delira B, NP  azithromycin (ZITHROMAX) 250 MG tablet Take 2 tablets  (500 mg total) by mouth daily for 1 day, THEN 1 tablet (250 mg total) daily for 4 days. 01/14/19 01/19/19  Georgetta HaberBurky, Carr Shartzer B, NP    Family History Family History  Problem Relation Age of Onset  . Hypertension Mother   . Diabetes Father     Social History Social History   Tobacco Use  . Smoking status: Current Some Day Smoker    Types: Cigarettes  . Smokeless tobacco: Never Used  Substance Use Topics  . Alcohol use: Yes  . Drug use: Yes    Types: Marijuana     Allergies   Patient has no known allergies.   Review of Systems Review of Systems   Physical Exam Triage Vital Signs ED Triage Vitals  Enc Vitals Group     BP 01/14/19 1454 107/68     Pulse Rate 01/14/19 1454 81     Resp 01/14/19 1454 17     Temp 01/14/19 1454 99 F (37.2 C)     Temp Source 01/14/19 1454 Oral     SpO2 01/14/19 1454 99 %     Weight --      Height --      Head Circumference --      Peak Flow --      Pain Score 01/14/19 1452 2     Pain Loc --      Pain Edu? --      Excl. in GC? --  No data found.  Updated Vital Signs BP 107/68 (BP Location: Left Arm)   Pulse 81   Temp 99 F (37.2 C) (Oral)   Resp 17   SpO2 99%    Physical Exam Constitutional:      Appearance: He is well-developed.  Cardiovascular:     Rate and Rhythm: Normal rate and regular rhythm.  Pulmonary:     Effort: Pulmonary effort is normal.     Breath sounds: Examination of the right-lower field reveals decreased breath sounds. Examination of the left-lower field reveals decreased breath sounds. Decreased breath sounds present.  Skin:    General: Skin is warm and dry.  Neurological:     Mental Status: He is alert and oriented to person, place, and time.      UC Treatments / Results  Labs (all labs ordered are listed, but only abnormal results are displayed) Labs Reviewed - No data to display  EKG   Radiology Dg Chest 2 View  Result Date: 01/14/2019 CLINICAL DATA:  Cough and shortness of breath  EXAM: CHEST - 2 VIEW COMPARISON:  April 25, 2018 FINDINGS: There is atelectatic change in the left base region with apparent small left pleural effusion. There may be superimposed infiltrate within this atelectasis. Lungs elsewhere are clear. Heart size and pulmonary vascularity are normal. No adenopathy. No bone lesions. IMPRESSION: Atelectatic change left base with small left pleural effusion. Suspect pneumonia intermingled with atelectasis in this area. Lungs elsewhere clear. Heart size normal. No adenopathy evident. Electronically Signed   By: Bretta Bang III M.D.   On: 01/14/2019 15:36    Procedures Procedures (including critical care time)  Medications Ordered in UC Medications - No data to display  Initial Impression / Assessment and Plan / UC Course  I have reviewed the triage vital signs and the nursing notes.  Pertinent labs & imaging results that were available during my care of the patient were reviewed by me and considered in my medical decision making (see chart for details).     Concern for LLL pneumonia on chest xray, has had similar in the past. Recent negative covid. Will cover with antibiotics at this time. Encouraged follow up with pcp for recheck in a few weeks. Return precautions provided. Patient verbalized understanding and agreeable to plan.   Final Clinical Impressions(s) / UC Diagnoses   Final diagnoses:  Community acquired pneumonia of left lower lobe of lung     Discharge Instructions     Your xray does have an area which is concerning for pneumonia, therefore I have sent antibiotics for you to complete to treat this. Complete prednisone as previously prescribed.  Push fluids to ensure adequate hydration and keep secretions thin.  Tylenol and/or ibuprofen as needed for pain or fevers.   Use of inhaler as needed for wheezing or shortness of breath.   If symptoms worsen or do not improve in the next week to return to be seen or to follow up with your  PCP.  Continue to decrease to quit smoking.      ED Prescriptions    Medication Sig Dispense Auth. Provider   amoxicillin-clavulanate (AUGMENTIN) 875-125 MG tablet Take 1 tablet by mouth every 12 (twelve) hours for 10 days. 20 tablet Linus Mako B, NP   azithromycin (ZITHROMAX) 250 MG tablet Take 2 tablets (500 mg total) by mouth daily for 1 day, THEN 1 tablet (250 mg total) daily for 4 days. 6 tablet Georgetta Haber, NP     PDMP  not reviewed this encounter.   Zigmund Gottron, NP 01/15/19 [phone]

## 2019-04-09 ENCOUNTER — Emergency Department (HOSPITAL_COMMUNITY)
Admission: EM | Admit: 2019-04-09 | Discharge: 2019-04-09 | Disposition: A | Discharge status: Home / Self Care | Attending: Emergency Medicine | Admitting: Emergency Medicine

## 2019-04-09 ENCOUNTER — Other Ambulatory Visit: Payer: Self-pay

## 2019-04-09 ENCOUNTER — Ambulatory Visit (HOSPITAL_COMMUNITY)
Admission: EM | Admit: 2019-04-09 | Discharge: 2019-04-09 | Discharge status: Against Medical Advice | Source: Home / Self Care

## 2019-04-09 DIAGNOSIS — F17210 Nicotine dependence, cigarettes, uncomplicated: Secondary | ICD-10-CM | POA: Diagnosis not present

## 2019-04-09 DIAGNOSIS — F1210 Cannabis abuse, uncomplicated: Secondary | ICD-10-CM | POA: Insufficient documentation

## 2019-04-09 DIAGNOSIS — Z8619 Personal history of other infectious and parasitic diseases: Secondary | ICD-10-CM | POA: Insufficient documentation

## 2019-04-09 DIAGNOSIS — Z760 Encounter for issue of repeat prescription: Secondary | ICD-10-CM | POA: Diagnosis not present

## 2019-04-09 DIAGNOSIS — J45909 Unspecified asthma, uncomplicated: Secondary | ICD-10-CM | POA: Diagnosis not present

## 2019-04-09 DIAGNOSIS — Z79899 Other long term (current) drug therapy: Secondary | ICD-10-CM | POA: Insufficient documentation

## 2019-04-09 MED ORDER — VALACYCLOVIR HCL 1 G PO TABS: 1000.0000 mg | ORAL_TABLET | Freq: Every day | ORAL | 0 refills | Status: AC

## 2019-04-09 NOTE — Discharge Instructions (Addendum)
You have been diagnosed today with Medication Refill and history of herpes.  At this time there does not appear to be the presence of an emergent medical condition, however there is always the potential for conditions to change. Please read and follow the below instructions.  Please return to the Emergency Department immediately for any new or worsening symptoms. Please be sure to follow up with your Primary Care Provider within one week regarding your visit today; please call their office to schedule an appointment even if you are feeling better for a follow-up visit. You have been given a refill of the medication Valtrex today.  As we discussed this medication may not help improve your current outbreak as it began multiple days ago.  In the future it is recommended that treatment for herpes began within 1 day of outbreak.  Please use the referrals given to you on this discharge paperwork to establish a primary care provider for future medication refills.  You may also contact the [facility] department regarding future STD concerns. Additionally as we discussed since you refused physical examination of your herpes lesions today I am unable to tell if any additional work-up or different medications may be needed.  Please follow-up with your primary care provider for recheck this week and return to the ER immediately for any new or worsening symptoms.  Get help right away if: Your symptoms are not improving with medicine. You have a fever. You have abdominal pain. You have redness, swelling, or pain in your eye. You notice new sores on other parts of your body. You have any new/concerning or worsening of symptoms  Please read the additional information packets attached to your discharge summary.  Do not take your medicine if  develop an itchy rash, swelling in your mouth or lips, or difficulty breathing; call 911 and seek immediate emergency medical attention if this occurs.  Note:  Portions of this text may have been transcribed using voice recognition software. Every effort was made to ensure accuracy; however, inadvertent computerized transcription errors may still be present.

## 2019-04-09 NOTE — ED Triage Notes (Signed)
Pt requesting refill of medication for recurrent herpes outbreak. VSS. NAD at present.

## 2019-04-09 NOTE — ED Notes (Signed)
Pt verbalized understanding of discharge instructions. Prescriptions reviewed, pt ambulated to lobby independently.

## 2019-04-09 NOTE — ED Provider Notes (Signed)
[place] [facility] DEPART[facility] Provider Note   CSN: [id number] Arrival date & time: 04/09/19  1452     History Chief Complaint  Patient presents with  . Medication Refill    Walter Stanley is a 24 y.o. male with history of herpes and asthma.  Patient requesting prescription of valacyclovir for a herpes outbreak.  He reports outbreak started to his penis 7 days ago, he has tried topical creams from the pharmacy with minimal relief.  He reports he has used valacyclovir for herpes infection in the past with improvement but has not been able to obtain a refill as he has no primary medical provider.  He reports mild burning constant, no aggravating factors, nonradiating slightly improved with cream.  He reports this is exactly similar to previous herpes outbreaks.  Denies fever/chills, headache, vision change, abdominal pain, nausea/vomiting, arthralgias/myalgias, penile discharge, hematuria/dysuria, new sexual encounters or concern for other STI. HPI     Past Medical History:  Diagnosis Date  . Asthma   . Herpes     There are no problems to display for this patient.   No past surgical history on file.     Family History  Problem Relation Age of Onset  . Hypertension Mother   . Diabetes Father     Social History   Tobacco Use  . Smoking status: Current Some Day Smoker    Types: Cigarettes  . Smokeless tobacco: Never Used  Substance Use Topics  . Alcohol use: Yes  . Drug use: Yes    Types: Marijuana    Home Medications Prior to Admission medications   Medication Sig Start Date End Date Taking? Authorizing Provider  albuterol (VENTOLIN HFA) 108 (90 Base) MCG/ACT inhaler Inhale 2 puffs into the lungs every 4 (four) hours as needed. 01/09/19   [provider]  predniSONE (DELTASONE) 20 MG tablet  01/09/19   [provider]  valACYclovir (VALTREX) 1000 MG tablet Take 1 tablet (1,000 mg total) by mouth daily for 5 days. 04/09/19  04/14/19  Deliah Boston, PA-C    Allergies    Patient has no known allergies.  Review of Systems   Review of Systems Ten systems are reviewed and are negative for acute change except as noted in the HPI  Physical Exam Updated Vital Signs BP 115/79 (BP Location: Right Arm)   Pulse 65   Temp 98.4 F (36.9 C) (Oral)   Resp 16   SpO2 100%   Physical Exam Constitutional:      General: He is not in acute distress.    Appearance: Normal appearance. He is well-developed. He is not ill-appearing or diaphoretic.  HENT:     Head: Normocephalic and atraumatic.     Right Ear: External ear normal.     Left Ear: External ear normal.     Nose: Nose normal.  Eyes:     General: Vision grossly intact. Gaze aligned appropriately.     Pupils: Pupils are equal, round, and reactive to light.  Neck:     Trachea: Trachea and phonation normal. No tracheal deviation.  Pulmonary:     Effort: Pulmonary effort is normal. No respiratory distress.  Genitourinary:    Comments: Patient refused GU examination Musculoskeletal:        General: Normal range of motion.     Cervical back: Normal range of motion.  Skin:    General: Skin is warm and dry.  Neurological:     Mental Status: He is alert.  GCS: GCS eye subscore is 4. GCS verbal subscore is 5. GCS motor subscore is 6.     Comments: Speech is clear and goal oriented, follows commands Major Cranial nerves without deficit, no facial droop Moves extremities without ataxia, coordination intact Normal gait  Psychiatric:        Behavior: Behavior normal.     ED Results / Procedures / Treatments   Labs (all labs ordered are listed, but only abnormal results are displayed) Labs Reviewed - No data to display  EKG None  Radiology No results found.  Procedures Procedures (including critical care time)  Medications Ordered in ED Medications - No data to display  ED Course  I have reviewed the triage vital signs and the nursing  notes.  Pertinent labs & imaging results that were available during my care of the patient were reviewed by me and considered in my medical decision making (see chart for details).    MDM Rules/Calculators/A&P                     24 year old male requesting refill of Valtrex for genital herpes onset 7 days ago.  He has no PCP to fill his prescription and has been using OTC cream with minimal relief.  He refused GU examination today, I had a discussion with patient of the need of a physical examination for definitive diagnosis and for correct medication treatment and future work-up and he stated understanding.  He still refused physical examination or any testing today after our discussion.  He reports he has had multiple flareups of genital herpes in the past and this is exactly similar.  I advised patient that a prescription of Valtrex would likely be unhelpful for his current episode as he is 7 days into his course of illness at this time and he stated understanding.  I advised patient should begin treatment with Valtrex for future herpes outbreak within 1 day of symptoms he stated understanding.  Discussed case with Dr. Anitra Lauth, will give patient Valtrex prescription today and encouraged him to follow-up with PCP and health department for follow-up.  Patient states understanding to return to the ER immediately for any new or worsening symptoms and that he may return at any time for full work-up including thorough physical examination.  At this time there does not appear to be any evidence of an acute emergency medical condition and the patient appears stable for discharge with appropriate outpatient follow up. Diagnosis was discussed with patient who verbalizes understanding of care plan and is agreeable to discharge. I have discussed return precautions with patient who verbalizes understanding of return precautions. Patient encouraged to follow-up with their PCP. All questions answered.  Patient has  been discharged in good condition.  Note: Portions of this report may have been transcribed using voice recognition software. Every effort was made to ensure accuracy; however, inadvertent computerized transcription errors may still be present. Final Clinical Impression(s) / ED Diagnoses Final diagnoses:  Encounter for medication refill  History of herpes genitalis    Rx / DC Orders ED Discharge Orders         Ordered    valACYclovir (VALTREX) 1000 MG tablet  Daily     04/09/19 1619           Bill Salinas, PA-C 04/09/19 1636    Gwyneth Sprout, MD 04/10/19 1047

## 2019-05-27 ENCOUNTER — Encounter (HOSPITAL_COMMUNITY): Payer: Self-pay

## 2019-05-27 ENCOUNTER — Other Ambulatory Visit: Payer: Self-pay

## 2019-05-27 ENCOUNTER — Emergency Department (HOSPITAL_COMMUNITY)
Admission: EM | Admit: 2019-05-27 | Discharge: 2019-05-27 | Disposition: A | Discharge status: Home / Self Care | Attending: Emergency Medicine | Admitting: Emergency Medicine

## 2019-05-27 ENCOUNTER — Emergency Department (HOSPITAL_COMMUNITY)

## 2019-05-27 DIAGNOSIS — F17210 Nicotine dependence, cigarettes, uncomplicated: Secondary | ICD-10-CM | POA: Diagnosis not present

## 2019-05-27 DIAGNOSIS — Z20822 Contact with and (suspected) exposure to covid-19: Secondary | ICD-10-CM | POA: Insufficient documentation

## 2019-05-27 DIAGNOSIS — J069 Acute upper respiratory infection, unspecified: Secondary | ICD-10-CM | POA: Insufficient documentation

## 2019-05-27 DIAGNOSIS — J45909 Unspecified asthma, uncomplicated: Secondary | ICD-10-CM | POA: Insufficient documentation

## 2019-05-27 DIAGNOSIS — R0602 Shortness of breath: Secondary | ICD-10-CM | POA: Diagnosis present

## 2019-05-27 LAB — SARS CORONAVIRUS 2 (TAT 6-24 HRS): SARS Coronavirus 2: NEGATIVE

## 2019-05-27 LAB — GROUP A STREP BY PCR: Group A Strep by PCR: NOT DETECTED

## 2019-05-27 MED ORDER — ALBUTEROL SULFATE HFA 108 (90 BASE) MCG/ACT IN AERS: 2.0000 | INHALATION_SPRAY | RESPIRATORY_TRACT | 1 refills | Status: AC | PRN

## 2019-05-27 MED ORDER — DEXAMETHASONE SODIUM PHOSPHATE 10 MG/ML IJ SOLN
10.0000 mg | Freq: Once | INTRAMUSCULAR | Status: AC
  Administered 2019-05-27: 10 mg via INTRAMUSCULAR
  Filled 2019-05-27: qty 1

## 2019-05-27 MED ORDER — PREDNISONE 20 MG PO TABS: 40.0000 mg | ORAL_TABLET | Freq: Every day | ORAL | 0 refills | Status: AC

## 2019-05-27 NOTE — ED Provider Notes (Signed)
[facility] DEPT Provider Note   CSN: [id number] Arrival date & time: 05/27/19  0013     History Chief Complaint  Patient presents with  . Shortness of Breath    Walter Stanley is a 24 y.o. male.  Patient presents to the emergency department for evaluation of sore throat, cough and difficulty breathing.  Symptoms began 2 days ago.  Patient does have a history of asthma, has been using his inhaler.  Sore throat has progressively worsened, hurts to swallow.        Past Medical History:  Diagnosis Date  . Asthma   . Herpes     There are no problems to display for this patient.   History reviewed. No pertinent surgical history.     Family History  Problem Relation Age of Onset  . Hypertension Mother   . Diabetes Father     Social History   Tobacco Use  . Smoking status: Current Some Day Smoker    Types: Cigarettes  . Smokeless tobacco: Never Used  Substance Use Topics  . Alcohol use: Yes  . Drug use: Yes    Types: Marijuana    Home Medications Prior to Admission medications   Medication Sig Start Date End Date Taking? Authorizing Provider  albuterol (VENTOLIN HFA) 108 (90 Base) MCG/ACT inhaler Inhale 2 puffs into the lungs every 4 (four) hours as needed for wheezing or shortness of breath. 05/27/19   Orpah Greek, MD  predniSONE (DELTASONE) 20 MG tablet Take 2 tablets (40 mg total) by mouth daily with breakfast. 05/27/19   Maxima Skelton, Gwenyth Allegra, MD    Allergies    Patient has no known allergies.  Review of Systems   Review of Systems  HENT: Positive for sore throat.   Respiratory: Positive for cough and shortness of breath.   All other systems reviewed and are negative.   Physical Exam Updated Vital Signs BP 138/76 (BP Location: Right Arm)   Pulse (!) 105   Temp 97.9 F (36.6 C) (Oral)   Resp 17   SpO2 99%   Physical Exam Vitals and nursing note reviewed.  Constitutional:      General: He is not in  acute distress.    Appearance: Normal appearance. He is well-developed.  HENT:     Head: Normocephalic and atraumatic.     Right Ear: Hearing normal.     Left Ear: Hearing normal.     Nose: Nose normal.     Mouth/Throat:     Pharynx: Posterior oropharyngeal erythema present.     Tonsils: No tonsillar exudate or tonsillar abscesses.  Eyes:     Conjunctiva/sclera: Conjunctivae normal.     Pupils: Pupils are equal, round, and reactive to light.  Cardiovascular:     Rate and Rhythm: Regular rhythm.     Heart sounds: S1 normal and S2 normal. No murmur. No friction rub. No gallop.   Pulmonary:     Effort: Pulmonary effort is normal. No respiratory distress.     Breath sounds: Normal breath sounds.  Chest:     Chest wall: No tenderness.  Abdominal:     General: Bowel sounds are normal.     Palpations: Abdomen is soft.     Tenderness: There is no abdominal tenderness. There is no guarding or rebound. Negative signs include Murphy's sign and McBurney's sign.     Hernia: No hernia is present.  Musculoskeletal:        General: Normal range of motion.  Cervical back: Normal range of motion and neck supple.  Skin:    General: Skin is warm and dry.     Findings: No rash.  Neurological:     Mental Status: He is alert and oriented to person, place, and time.     GCS: GCS eye subscore is 4. GCS verbal subscore is 5. GCS motor subscore is 6.     Cranial Nerves: No cranial nerve deficit.     Sensory: No sensory deficit.     Coordination: Coordination normal.  Psychiatric:        Speech: Speech normal.        Behavior: Behavior normal.        Thought Content: Thought content normal.     ED Results / Procedures / Treatments   Labs (all labs ordered are listed, but only abnormal results are displayed) Labs Reviewed  GROUP A STREP BY PCR  SARS CORONAVIRUS 2 (TAT 6-24 HRS)    EKG None  Radiology DG Chest Port 1 View  Result Date: 05/27/2019 CLINICAL DATA:  24 year old male with  cough and shortness of breath since yesterday. EXAM: PORTABLE CHEST 1 VIEW COMPARISON:  01/14/2019 chest radiographs and earlier. FINDINGS: Portable AP semi upright view at 0056 hours. Normal lung volumes and mediastinal contours. Visualized tracheal air column is within normal limits. Allowing for portable technique the lungs are now clear. Resolved abnormal left lower lung opacity since October. Negative visible bowel gas pattern and osseous structures. IMPRESSION: Negative portable chest. Electronically Signed   By: Odessa Fleming M.D.   On: 05/27/2019 01:14    Procedures Procedures (including critical care time)  Medications Ordered in ED Medications  dexamethasone (DECADRON) injection 10 mg (10 mg Intramuscular Given 05/27/19 0106)    ED Course  I have reviewed the triage vital signs and the nursing notes.  Pertinent labs & imaging results that were available during my care of the patient were reviewed by me and considered in my medical decision making (see chart for details).    MDM Rules/Calculators/A&P                      Patient presents to the emergency department with multiple mild URI complaints.  He is complaining of sore throat.  Examination reveals mild erythema but no significant swelling, no tonsillar enlargement, no exudate.  No sign of abscess.  Patient also reports that he is having trouble breathing.  He indicates that it is the throat that is causing his breathing difficulty.  His lungs are clear currently, no wheezing.  He does have a history of asthma.  Rapid strep negative.  Chest x-ray clear, no evidence of pneumonia.  Covid pending.  Administer Decadron and will prescribe prednisone.  Continue albuterol.  Final Clinical Impression(s) / ED Diagnoses Final diagnoses:  Upper respiratory tract infection, unspecified type    Rx / DC Orders ED Discharge Orders         Ordered    predniSONE (DELTASONE) 20 MG tablet  Daily with breakfast     05/27/19 0153    albuterol  (VENTOLIN HFA) 108 (90 Base) MCG/ACT inhaler  Every 4 hours PRN     05/27/19 0153           Gilda Crease, MD 05/27/19 [phone]

## 2019-05-27 NOTE — ED Notes (Signed)
Throughout triage patient continues to take his mask off, this nurse encouraged multiple times to keep the mask on due to him having possible Covid-19 symptoms.

## 2019-05-27 NOTE — ED Triage Notes (Signed)
Patient arrived stating yesterday he has had shortness of breath, a raspy voice, and a productive cough. Patient using inhaler. In no acute distress

## 2019-07-01 ENCOUNTER — Emergency Department (HOSPITAL_COMMUNITY)
Admission: EM | Admit: 2019-07-01 | Discharge: 2019-07-01 | Disposition: A | Discharge status: Home / Self Care | Attending: Emergency Medicine | Admitting: Emergency Medicine

## 2019-07-01 ENCOUNTER — Other Ambulatory Visit: Payer: Self-pay

## 2019-07-01 ENCOUNTER — Encounter (HOSPITAL_COMMUNITY): Payer: Self-pay | Admitting: Emergency Medicine

## 2019-07-01 ENCOUNTER — Emergency Department (HOSPITAL_COMMUNITY)

## 2019-07-01 DIAGNOSIS — J45909 Unspecified asthma, uncomplicated: Secondary | ICD-10-CM | POA: Diagnosis not present

## 2019-07-01 DIAGNOSIS — Y929 Unspecified place or not applicable: Secondary | ICD-10-CM | POA: Insufficient documentation

## 2019-07-01 DIAGNOSIS — Z79899 Other long term (current) drug therapy: Secondary | ICD-10-CM | POA: Insufficient documentation

## 2019-07-01 DIAGNOSIS — X509XXA Other and unspecified overexertion or strenuous movements or postures, initial encounter: Secondary | ICD-10-CM | POA: Insufficient documentation

## 2019-07-01 DIAGNOSIS — M21242 Flexion deformity, left finger joints: Secondary | ICD-10-CM | POA: Insufficient documentation

## 2019-07-01 DIAGNOSIS — Y9367 Activity, basketball: Secondary | ICD-10-CM | POA: Diagnosis not present

## 2019-07-01 DIAGNOSIS — F17210 Nicotine dependence, cigarettes, uncomplicated: Secondary | ICD-10-CM | POA: Diagnosis not present

## 2019-07-01 DIAGNOSIS — Y999 Unspecified external cause status: Secondary | ICD-10-CM | POA: Diagnosis not present

## 2019-07-01 DIAGNOSIS — S66809A Unspecified injury of other specified muscles, fascia and tendons at wrist and hand level, unspecified hand, initial encounter: Secondary | ICD-10-CM

## 2019-07-01 DIAGNOSIS — M79645 Pain in left finger(s): Secondary | ICD-10-CM | POA: Diagnosis present

## 2019-07-01 NOTE — ED Notes (Signed)
RN went over dc paperwork with pt who verbalized understanding. Pt alert and no distress noted when ambulated to exit.

## 2019-07-01 NOTE — ED Provider Notes (Signed)
[facility] EMERGENCY DEPARTMENT Provider Note   CSN: [id number] Arrival date & time: 07/01/19  0007     History Chief Complaint  Patient presents with  . Finger Injury    Walter Stanley is a 24 y.o. male.  Patient with asthma history presents with right middle finger injury and deformity.  Patient was playing basketball and jammed it.  No other injuries.  Minimal discomfort.        Past Medical History:  Diagnosis Date  . Asthma   . Herpes     There are no problems to display for this patient.   History reviewed. No pertinent surgical history.     Family History  Problem Relation Age of Onset  . Hypertension Mother   . Diabetes Father     Social History   Tobacco Use  . Smoking status: Current Some Day Smoker    Types: Cigarettes  . Smokeless tobacco: Never Used  Substance Use Topics  . Alcohol use: Yes  . Drug use: Yes    Types: Marijuana    Home Medications Prior to Admission medications   Medication Sig Start Date End Date Taking? Authorizing Provider  albuterol (VENTOLIN HFA) 108 (90 Base) MCG/ACT inhaler Inhale 2 puffs into the lungs every 4 (four) hours as needed for wheezing or shortness of breath. 05/27/19   Gilda Crease, MD  predniSONE (DELTASONE) 20 MG tablet Take 2 tablets (40 mg total) by mouth daily with breakfast. 05/27/19   Pollina, Canary Brim, MD    Allergies    Patient has no known allergies.  Review of Systems   Review of Systems  Constitutional: Negative for fever.  Musculoskeletal: Positive for joint swelling.  Neurological: Negative for numbness.    Physical Exam Updated Vital Signs BP 104/67 (BP Location: Left Arm)   Pulse 77   Temp (!) 97.5 F (36.4 C) (Oral)   Resp 14   Ht 5\' 8"  (1.727 m)   Wt 60 kg   SpO2 97%   BMI 20.11 kg/m   Physical Exam Vitals and nursing note reviewed.  HENT:     Head: Normocephalic and atraumatic.  Musculoskeletal:        General: Tenderness,  deformity and signs of injury present. No swelling.     Comments: Patient has minimal tenderness and flexion deformity right middle finger dip.  No tenderness otherwise.  Neurological:     Mental Status: He is alert.     ED Results / Procedures / Treatments   Labs (all labs ordered are listed, but only abnormal results are displayed) Labs Reviewed - No data to display  EKG None  Radiology DG Finger Middle Right  Result Date: 07/01/2019 CLINICAL DATA:  Injury EXAM: RIGHT MIDDLE FINGER 2+V COMPARISON:  None. FINDINGS: No fracture or dislocation.  Flexion deformity at the D IP joint. IMPRESSION: Flexion deformity at the D IP joint.  No fracture is seen Electronically Signed   By: 08/31/2019 M.D.   On: 07/01/2019 01:13    Procedures .Splint Application  Date/Time: 07/01/2019 1:35 AM Performed by: 08/31/2019, MD Authorized by: Blane Ohara, MD   Consent:    Consent obtained:  Verbal   Consent given by:  Patient   Risks discussed:  Pain   Alternatives discussed:  No treatment Pre-procedure details:    Sensation:  Normal   Skin color:  Normal Procedure details:    Laterality:  Right   Location:  Finger   Finger:  R long finger  Strapping: no     Splint type:  Finger   Supplies:  Aluminum splint Post-procedure details:    Pain:  Improved   Sensation:  Normal   Skin color:  Normal   Patient tolerance of procedure:  Tolerated well, no immediate complications   (including critical care time)  Medications Ordered in ED Medications - No data to display  ED Course  I have reviewed the triage vital signs and the nursing notes.  Pertinent labs & imaging results that were available during my care of the patient were reviewed by me and considered in my medical decision making (see chart for details).    MDM Rules/Calculators/A&P                     Patient has isolated right middle finger injury, x-ray reviewed and clinical concern for flexion tendon injury.   Finger splint adjusted and placed by myself.  Discussed wearing it all day and night until follow-up with orthopedic doctor.  Final Clinical Impression(s) / ED Diagnoses Final diagnoses:  Injury of flexor tendon of hand, initial encounter    Rx / DC Orders ED Discharge Orders    None       Elnora Morrison, MD 07/01/19 [phone]

## 2019-07-01 NOTE — Discharge Instructions (Signed)
Wear splint on finger all day and night until you see the bone doctor. Tylenol as needed for pain.

## 2019-07-01 NOTE — ED Triage Notes (Signed)
Patient injured his right distal middle finger while playing football this afternoon , presents with middle finger deformity .

## 2021-06-27 IMAGING — DX DG CHEST 1V PORT
1 series · 1 of 1 positions shown · non-contrast
Comparison: 01/14/2019 chest radiographs and earlier.

CLINICAL DATA: 23-year-old male with cough and shortness of breath
since yesterday.

EXAM:
PORTABLE CHEST 1 VIEW

[chest ap]
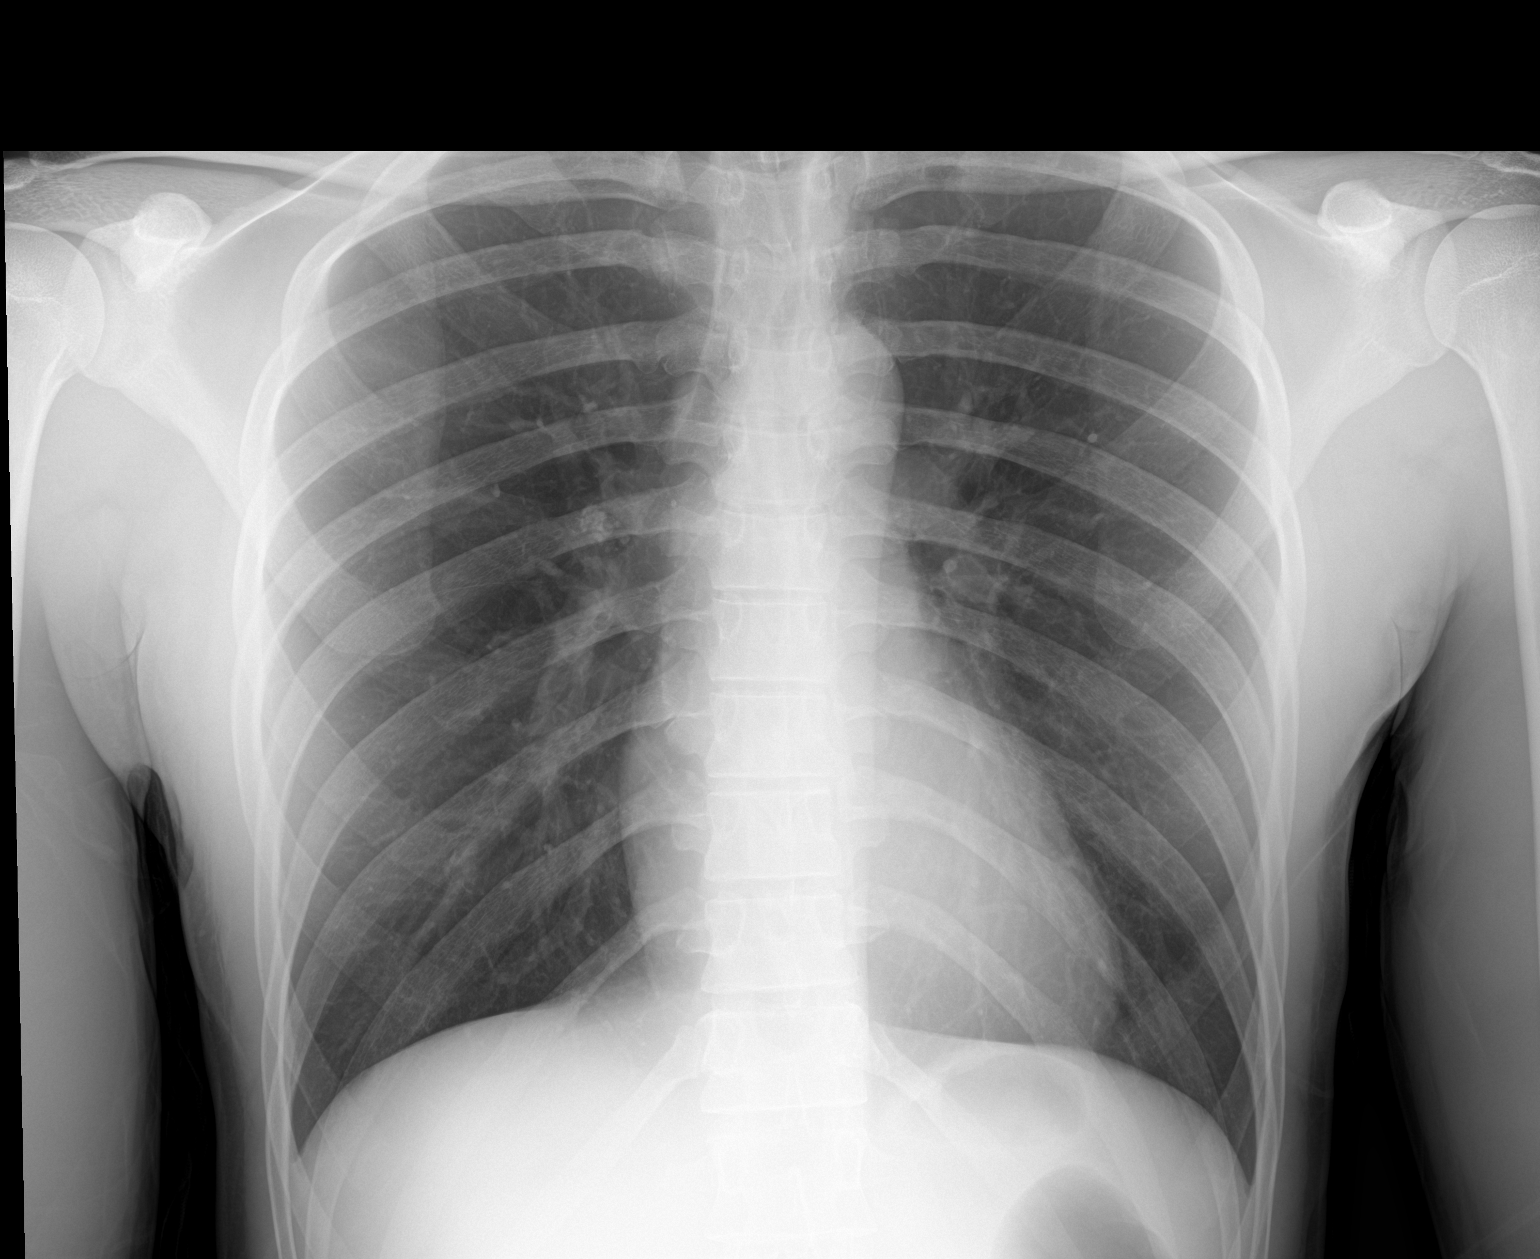

[1 of 1 positions shown; findings below may reference images not displayed]

FINDINGS: Portable AP semi upright view at 2214 hours. Normal lung volumes and
mediastinal contours. Visualized tracheal air column is within
normal limits. Allowing for portable technique the lungs are now
clear. Resolved abnormal left lower lung opacity since [REDACTED].
Negative visible bowel gas pattern and osseous structures.
IMPRESSION: Negative portable chest.

## 2021-08-01 IMAGING — DX DG FINGER MIDDLE 2+V*R*
3 series · 3 of 3 positions shown · non-contrast
Comparison: None.

CLINICAL DATA: Injury

EXAM:
RIGHT MIDDLE FINGER 2+V

[finger ap]
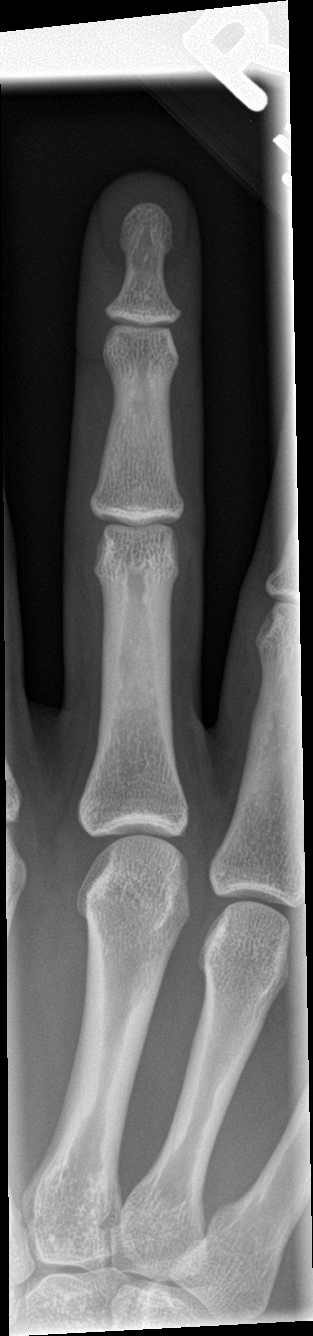

[finger obl]
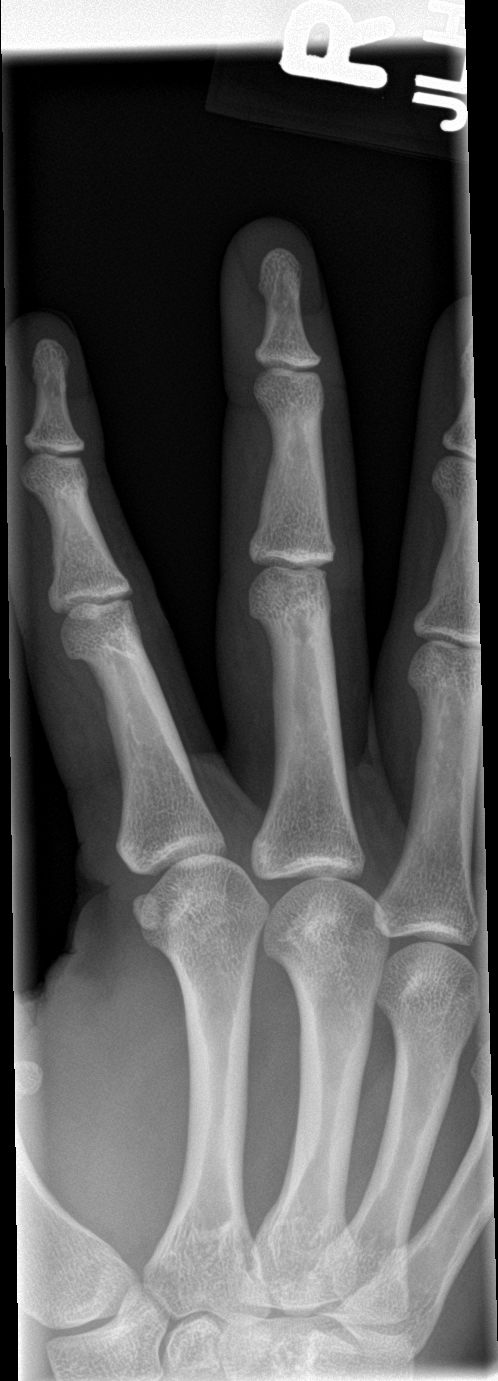

[finger lat]
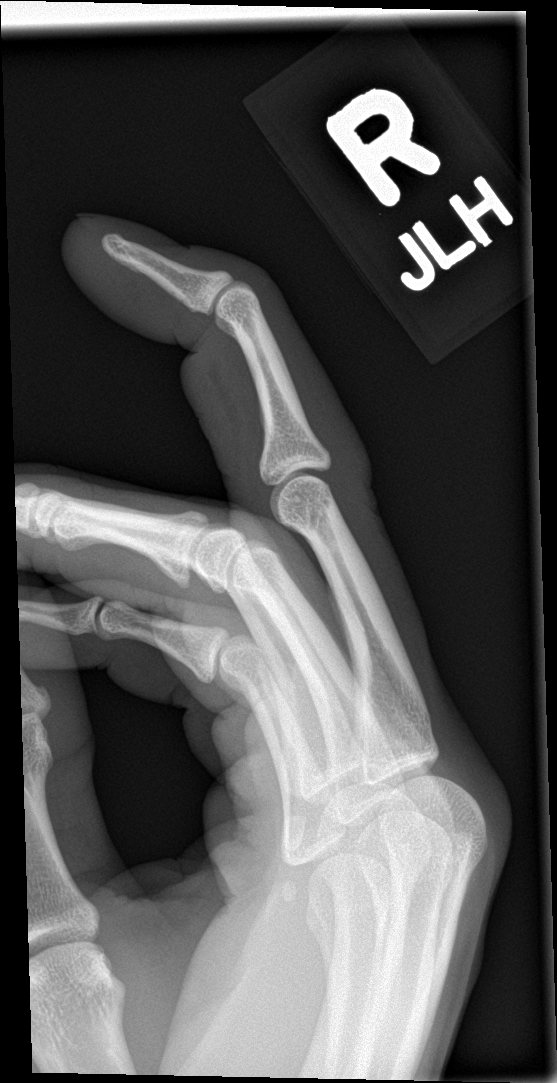

[3 of 3 positions shown; findings below may reference images not displayed]

FINDINGS: No fracture or dislocation.  Flexion deformity at the D IP joint.
IMPRESSION: Flexion deformity at the D IP joint.  No fracture is seen
